# Patient Record
Sex: Male | Born: 1945 | Race: White | Hispanic: No | Marital: Married | Smoking: Former smoker
Health system: Southern US, Community
[De-identification: ages and names within clinical notes are randomized; demographics above are authoritative.]

## PROBLEM LIST (undated history)

## (undated) DIAGNOSIS — Z9989 Dependence on other enabling machines and devices: Secondary | ICD-10-CM

## (undated) DIAGNOSIS — A809 Acute poliomyelitis, unspecified: Secondary | ICD-10-CM

## (undated) DIAGNOSIS — N281 Cyst of kidney, acquired: Secondary | ICD-10-CM

## (undated) DIAGNOSIS — I499 Cardiac arrhythmia, unspecified: Secondary | ICD-10-CM

## (undated) DIAGNOSIS — I10 Essential (primary) hypertension: Secondary | ICD-10-CM

## (undated) DIAGNOSIS — H9190 Unspecified hearing loss, unspecified ear: Secondary | ICD-10-CM

## (undated) DIAGNOSIS — R7301 Impaired fasting glucose: Secondary | ICD-10-CM

## (undated) DIAGNOSIS — K429 Umbilical hernia without obstruction or gangrene: Secondary | ICD-10-CM

## (undated) DIAGNOSIS — K5790 Diverticulosis of intestine, part unspecified, without perforation or abscess without bleeding: Secondary | ICD-10-CM

## (undated) DIAGNOSIS — N529 Male erectile dysfunction, unspecified: Secondary | ICD-10-CM

## (undated) DIAGNOSIS — Z8042 Family history of malignant neoplasm of prostate: Secondary | ICD-10-CM

## (undated) DIAGNOSIS — Z973 Presence of spectacles and contact lenses: Secondary | ICD-10-CM

## (undated) DIAGNOSIS — C61 Malignant neoplasm of prostate: Secondary | ICD-10-CM

## (undated) DIAGNOSIS — N318 Other neuromuscular dysfunction of bladder: Secondary | ICD-10-CM

## (undated) DIAGNOSIS — M810 Age-related osteoporosis without current pathological fracture: Secondary | ICD-10-CM

## (undated) DIAGNOSIS — G14 Postpolio syndrome: Secondary | ICD-10-CM

## (undated) HISTORY — DX: Cardiac arrhythmia, unspecified: I49.9

## (undated) HISTORY — DX: Family history of malignant neoplasm of prostate: Z80.42

## (undated) HISTORY — DX: Umbilical hernia without obstruction or gangrene: K42.9

## (undated) HISTORY — DX: Diverticulosis of intestine, part unspecified, without perforation or abscess without bleeding: K57.90

## (undated) HISTORY — PX: SHOULDER ARTHROSCOPY WITH OPEN ROTATOR CUFF REPAIR: SHX6092

## (undated) HISTORY — PX: PROSTATE BIOPSY: SHX241

## (undated) HISTORY — PX: FOOT SURGERY: SHX648

## (undated) HISTORY — DX: Age-related osteoporosis without current pathological fracture: M81.0

## (undated) HISTORY — PX: TONSILLECTOMY AND ADENOIDECTOMY: SUR1326

## (undated) HISTORY — DX: Impaired fasting glucose: R73.01

---
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
## 2001-11-25 ENCOUNTER — Encounter: Admission: RE | Admit: 2001-11-25 | Discharge: 2002-02-23 | Payer: Self-pay | Admitting: Family Medicine

## 2001-12-30 ENCOUNTER — Ambulatory Visit (HOSPITAL_COMMUNITY): Admission: RE | Admit: 2001-12-30 | Discharge: 2001-12-30 | Payer: Self-pay | Admitting: Gastroenterology

## 2007-04-30 ENCOUNTER — Ambulatory Visit (HOSPITAL_BASED_OUTPATIENT_CLINIC_OR_DEPARTMENT_OTHER): Admission: RE | Admit: 2007-04-30 | Discharge: 2007-05-01 | Payer: Self-pay | Admitting: Orthopedic Surgery

## 2007-10-15 ENCOUNTER — Ambulatory Visit (HOSPITAL_BASED_OUTPATIENT_CLINIC_OR_DEPARTMENT_OTHER): Admission: RE | Admit: 2007-10-15 | Discharge: 2007-10-15 | Payer: Self-pay | Admitting: Orthopedic Surgery

## 2007-10-22 ENCOUNTER — Ambulatory Visit: Payer: Self-pay | Admitting: Vascular Surgery

## 2011-03-21 NOTE — Op Note (Signed)
Alan Rivera, Alan Rivera             ACCOUNT NO.:  [id number]   MEDICAL RECORD NO.:  [id number]          PATIENT TYPE:  AMB   LOCATION:  DSC                          FACILITY:  [facility]   PHYSICIAN:  Katy Fitch. Sypher, M.D. DATE OF BIRTH:  06-07-1946   DATE OF PROCEDURE:  04/30/2007  DATE OF DISCHARGE:                               OPERATIVE REPORT   PREOP DIAGNOSIS:  1. Chronic stage III impingement right shoulder with MRI evidence of a      full-thickness rotator cuff tear involving supraspinatus and      infraspinatus tendons with unfavorable acromial and AC joint      anatomy and chronic arthritis of the AC joint and MRI proven labral      tear.  2. Superior anterior labral tear consistent with MRI findings and full-      thickness mildly retracted rotator cuff tear involving the entire      supraspinatus and infraspinatus tendon footprints.   OPERATION:  1. Examination of right shoulder under anesthesia.  2. Arthroscopic evaluation of right glenohumeral joint followed by      arthroscopic debridement of fragments of a degenerative superior      and anterior labral tear.  3. Arthroscopic subacromial decompression with acromioplasty,      coracoacromial ligament relaxation, and bursectomy.  4. Arthroscopic resection of distal clavicle.  5. 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   OPERATING SURGEON:  Katy Fitch. Sypher, M.D.   ASSISTANT:  Molly Maduro Dasnoit PA-C.   ANESTHESIA:  General endotracheal supplemented by a right interscalene  block.   SUPERVISING ANESTHESIOLOGIST:  Burna Forts, M.D.   INDICATIONS:  Alan Rivera, Alan Rivera is a 65 year old gentleman referred  through the courtesy of Dr. Illa Level, M.D. for evaluation of a  chronically painful right shoulder.  Alan Rivera has had several years  of shoulder pain and clinical examination demonstrated very  unfavorable  AC and acromial anatomy.  He had marked sclerosis of the greater  tuberosity and cyst formation in the tuberosity consistent with rotator  cuff disease.  Plain films documented severe AC arthropathy; and his MRI  documented a labral tear, a rotator cuff tear, unfavorable acromial and  AC anatomy.  We recommended that he proceed with arthroscopic evaluation  of his shoulder, labral debridement, subacromial decompression, distal  clavicle resection, and open rotator cuff repair.  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  He has made arrangements for additional assistive  devices at home; and waited until his wife was off for the summer to  have maximum help with activities of daily living at home.   After informed consent he is brought to the operating room at this time.  Preoperatively he was advised of the potential risks and benefits of  surgery in detail.  Questions were invited and answered.   PROCEDURE:  Alan Rivera is brought to the operating room and placed  in the supine position on  the operating table.  After an anesthesia  consult in the holding area, Dr. Jacklynn Bue had placed an interscalene  block without complication.  Excellent anesthesia of the right  forequarter was achieved.  Alan Rivera was placed under general  endotracheal anesthesia under Dr. Jacklynn Bue direct supervision followed  by careful positioning in the beach-chair position with the aid of a  torso and head holder designed for short arthroscopy.   The entire right upper extremity and forequarter were prepped with  DuraPrep followed by draping with impervious arthroscopy drapes.  Examination of the shoulder under anesthesia revealed no evidence of  instability or adhesive capsulitis.  The scope was placed through with  standard posterior viewing portal.  Diagnostic arthroscopy revealed  intact hyaline articular cartilage surfaces of  the humeral head and  glenoid.  There was a degenerative labrum from 11 o'clock posteriorly to  approximately 3 o'clock anteriorly.  Fragments of the labrum were  impinging within the joint.   An anterior portal was created under direct vision followed by use of  the 4.5-mm suction shaver to thoroughly debride the labrum to a stable  margin.  The biceps origin was sound.  The biceps tendon was normal  through the rotator interval.  There is minor subscapularis tendinopathy  anteriorly, but no sign of retracted tear.  The deep surface of the  infraspinatus and supraspinatus had completely released from the greater  tuberosity, and had retracted less than a centimeter.  After a  photographic documentation of pathology, the deep surface of the rotator  cuff was debrided followed by removal of the arthroscopic equipment.  The bursa was then instrumented from posterior approach followed by  bursectomy, coracoacromial ligament relaxation, and acromioplasty  leveling the acromion to a type 1 morphology with primary bone removal  along the medial anterior margin of the AC joint and anterior medial  acromion.   The distal 15 mm of clavicle was removed arthroscopically and hemostasis  achieved with the radiofrequency probe.  After documentation of the  decompression, the scope was removed and an anterior middle-third  deltoid splitting incision was fashioned without release of any of the  deltoid origin.  After the bursa was entered, a cerebellar retractor was  placed followed by reconstruction of the rotator cuff.  The profile the  greater tuberosity was lowered 3-4 mm with the power bur, removing the  reactive osteophyte that had formed from chronic impingement.  The  insertional anatomy of the supraspinatus and infraspinatus was  decorticated with the power bur followed by placement of a medial  corkscrew anchor.  One for the supraspinatus, and one for the  infraspinatus.  A McLaughlin  grasping suture was placed in the  supraspinatus and infraspinatus followed by lateral displacement of the  conjoined tendon to an anatomic position with through bone tunnels.  This was tensioned, and prior to tying knots the four mattress sutures  from the anchors were placed through the infraspinatus and supraspinatus  creating an excellent medial footprint.  The McLaughlin suture was  tensioned reproducing a normal lateralized insertion of the conjoined  tendon followed by use of the mattress sutures from the biocorkscrew  anchors to inset the medial footprints of the infraspinatus and  supraspinatus.   We attempted an over-the-top suture technique utilizing an Arthrex push  lock anchor.  We discovered that this led to a technical issue with  incompatibility between the Biomet sutures which are coated and the push  lock.  The push lock was placed with  standard technique and driven home  with a mallet.  After the introducer device was removed, I noted  immediately tension was relaxing in the sutures leading me to conclude  that inadequate purchase was achieved with the push lock.  The sutures  indeed were loose; therefore, they were converted to traditional  mattress sutures followed by use of a series of figure-of-eight sutures  of #0 Vicryl to inset the lateral margin of the repair with through bone  suture technique.   An anatomic repair of the rotator cuff was achieved.  After power  irrigation in the subacromial space.  The wound was closed by a corner  suture of #2 FiberWire reconstructing the origin of the anterior middle  third junction of the deltoid followed by use of simple sutures of #0  Vicryl to close the deltoid split.  The skin was repaired with subdermal  sutures of 2-0 Vicryl and intradermal 3-00 Prolene with Steri-Strips.   There no apparent complications.  Alan Rivera tolerated surgery and  anesthesia well.  He was awakened from general anesthesia and   transferred to the recovery room with stable signs.  He will be admitted  to the recovery care center for observation of his vital signs; and we  have provided IV antibiotic prophylaxis in the form of Ancef 1 gram q.8  h. times three doses and appropriate p.o. and IV analgesics with  Dilaudid and Percocet.      Katy Fitch Sypher, M.D.  Electronically Signed     RVS/MEDQ  D:  04/30/2007  T:  05/01/2007  Job:  [id number]   cc:   Sal[employer] Dr. Illa Level

## 2011-03-21 NOTE — Consult Note (Signed)
VASCULAR SURGERY CONSULTATION   KHAIRI, GARMAN  DOB:  May 26, 1946                                       10/22/2007  ZOXWR#:[id number]   Patient is a 65 year old male patient with a history of polio at age 74,  which effected all of his musculature.  He has had diffuse weakness in  his upper and lower extremities, and the lower extremity weakness has  progressed over the last few years, involving more of his proximal  muscles.  He has noticed a cold sensation in both legs and feet over the  last year but has had no history of nonhealing ulcers, infection,  gangrene, or rest pain in the feet.  He is able to ambulate about 50-75  yards because of his post polio syndrome.   PAST MEDICAL HISTORY:  Positive for hypertension, treated medically.  Negative for diabetes, coronary artery disease, COPD, or stroke.   PAST SURGICAL HISTORY:  1. Bilateral lower extremity surgery on the feet as a child for his      polio.  2. Hernia repair.  3. Tonsillectomy and adenoidectomy.  4. Right rotator cuff surgery.  5. Arthroscopic surgery, left shoulder, a few months ago.   FAMILY HISTORY:  Negative for coronary artery disease, diabetes and  strokes.   SOCIAL HISTORY:  He is married.  Works as a Production manager for [employer].  Has two  children.  Does not use tobacco or alcohol.   REVIEW OF SYSTEMS:  Negative with the exception of his musculature  problems.  Please see health history form.   MEDICATIONS:  1. Aspirin 2 daily.  2. Lisinopril 5 mg 1 daily.  3. Multivitamins 1 daily.  4. Ibuprofen 600 mg 4 per day.   ALLERGIES:  SHELLFISH.Marland Kitchen   PHYSICAL EXAMINATION:  Blood pressure 122/78, heart rate is 80,  respirations 16.  Generally, he is a middle-aged male patient who has  obvious muscle wasting in his lower extremities.  He is alert and  oriented x3.  His neck is supple, 3+ carotid pulses palpable.  No bruits  are audible.  Neurologic exam in the upper extremities, grossly  unremarkable.  He has no palpable adenopathy in the neck.  Chest: Clear  to auscultation.  Cardiovascular:  Regular rhythm with no murmurs.  He  has excellent radial pulses bilaterally.  No skin rashes are noted.  Abdomen is soft and nontender with no palpable masses. He has 3+  femoral, popliteal, and posterior tibial pulses bilaterally.  Both feet  are adequately perfused, but there is muscle wasting, as noted, in the  calves and thighs bilaterally.  There is no evidence of any infection,  ulceration, or gangrene.   Lower extremity arterial Dopplers were done today and were normal with  ABIs greater than 1 bilaterally and normal waveforms.   I have reassured him regarding his vasculature, that there are no  abnormalities, and that his leg weakness is clearly due to his post  polio syndrome.  If we can be of further assistance in his management in  the future, we will be happy to do so at that time.   Quita Skye Hart [place], M.D.  Electronically Signed  JDL/MEDQ  D:  10/22/2007  T:  10/23/2007  Job:  [id number]   cc:   Vikki Ports, M.D.  Chales Salmon. Abigail Miyamoto, M.D.

## 2011-03-21 NOTE — Op Note (Signed)
NAMEEMANUAL, LAMOUNTAIN             ACCOUNT NO.:  [id number]   MEDICAL RECORD NO.:  [id number]          PATIENT TYPE:  AMB   LOCATION:  DSC                          FACILITY:  [facility]   PHYSICIAN:  Katy Fitch. Sypher, M.D. DATE OF BIRTH:  07-Dec-1945   DATE OF PROCEDURE:  10/15/2007  DATE OF DISCHARGE:                               OPERATIVE REPORT   PREOPERATIVE DIAGNOSIS:  Stage 2 impingement, left shoulder, with severe  acromioclavicular degenerative arthritis and unfavorable medial acromial  and inferior distal clavicle profile causing chronic impingement on  rotator cuff, with MRI evidence of a partial-thickness deep surface  rotator cuff tear, a stress reaction in the left humeral head, and  probable labral degenerative changes.   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   PROCEDURE:  1. Examination of left shoulder under anesthesia followed by      diagnostic arthroscopy of the left shoulder.  2. Arthroscopic debridement of degenerative labrum and rotator cuff      tear.  3. Arthroscopic subacromial decompression with bursectomy,      coracoacromial ligament relaxation, and bursectomy.  4. Arthroscopic distal clavicle resection.   OPERATING SURGEON:  Katy Fitch. Sypher, M.D.   ASSISTANT:  Marveen Reeks. Dasnoit, P.A.-C.   ANESTHESIA:  General endotracheal supplemented by left interscalene  block.   SUPERVISING ANESTHESIOLOGIST:  Zenon Mayo, M.D.   INDICATIONS FOR PROCEDURE:  Bud, Kaeser. is a 65 year old  gentleman referred through the courtesy of Dr. Henrine Screws of [place],  [facility] for evaluation and management of left shoulder pain.  He is status post  reconstruction of his right rotator cuff with a very  satisfactory outcome.   Mr. Lama had concerns regarding persistent left shoulder pain and had  plain x-rays documenting very unfavorable AC anatomy.   An MRI of the left shoulder was obtained which documented a partial-  thickness rotator cuff tear.   We advised Mr. Covalt that at this time we would proceed with  arthroscopic decompression of the shoulder, anticipating possible repair  if we identified a full-thickness rotator cuff tear.   After informed consent, he is brought to the operating room at this  time.   Preoperatively, he was seen in consultation by Dr. Autumn Patty who  placed a left interscalene block.  Excellent anesthesia was achieved  without complication.   Mr. Bleier is brought to the operating room at this time.   DESCRIPTION OF PROCEDURE:  Sandra Cockayne, Montez Hageman. is brought to the  operating room and placed in the supine position on the operating room  table.  Following induction of general endotracheal anesthesia under Dr.  Jarrett Ables direct supervision, he was carefully positioned in the  beach chair position with the aid of a torso and head holder designed  for shoulder arthroscopy.   The entire left upper extremity and forequarter were prepped with  DuraPrep and draped with  impervious arthroscopy drapes.  Ancef 1 gram  was administered as an IV prophylactic antibiotic.   The procedure commenced with examination of the shoulder under  anesthesia.  The shoulder was fundamentally stable.  The shoulder was  then instrumented through the standard posterior viewing portal with an  arthroscope utilizing blunt technique.  Diagnostic arthroscopy revealed  a very degenerative labrum extending from 3 o'clock anteriorly to 9  o'clock posteriorly.  Fragments of labrum were hanging within the joint.  There was a moderate degree of synovitis.   An anterior portal was created under direct vision after  sounding with a  spinal needle followed by use of a 4.5-mm suction shaver to thoroughly  debride the glenohumeral synovitis, labrum, and after use of a switching  stick to place the scope anteriorly, the posterior labrum was thoroughly  debrided under direct vision with the shaver brought in through the  posterior viewing portal.   The deep surface of the rotator cuff was inspected.  There was about a  50% deep surface articular side partial-thickness tear.   Given the fact that the subscapularis, teres minor, and posterior  infraspinatus were sound and the anterior portion of the supraspinatus  was sound, in my judgment, debridement was acceptable at this time.   We had discussed the possibility of an arthroscopic or open repair if a  significant tear was noted.   Given the dramatic degree of AC impingement, there is a high probability  that Mr. Kincer will be relieved of his symptoms without subjecting him  to the prolonged rehabilitation of a cuff repair at this time.   We elected to debride the deep surface of the partial articular surface  tear with a suction shaver followed by removal of the arthroscopic  equipment.   The scope was then placed in the subacromial space followed by  bursectomy, relaxation of the coracoacromial ligament, leveling the  acromion to a type 1 morphology, and resection of the distal 18 mm of  the clavicle with the suction bur brought in anteriorly.  The cuff was  inspected and found to be intact at its insertion at the greater  tuberosity.  There was considerable fraying of the cuff directly beneath  the [facility] joint due to chronic piling up of the tendon beneath the AC  osteophytes.   Photographic documentation of the pathology was accomplished with the  digital camera followed by removal of the arthroscopic equipment.   The portals were repaired with mattress suture of 3-0 Prolene.  Voluminous gauze dressings were applied with paper tape.   Mr.  Strohm will be allowed to return home to the care of his wife.  We  will ask him to return to our office for followup in 24 hours at which  time we anticipate dressing change to Tegaderm dressings and advancement  to an exercise program.      Katy Fitch. Sypher, M.D.  Electronically Signed     RVS/MEDQ  D:  10/15/2007  T:  10/15/2007  Job:  [id number]   cc:   Chales Salmon. Abigail Miyamoto, M.D.

## 2011-03-24 NOTE — Procedures (Signed)
[facility]  Patient:    Alan Rivera, Alan Rivera Visit Number: [id number] MRN: [id number]          Service Type: END Location: ENDO Attending Physician:  Louie Bun Dictated by:   Everardo All Madilyn Fireman, M.D. Proc. Date: 12/30/01 Admit Date:  12/30/2001   CC:         Chales Salmon. Abigail Miyamoto, M.D.   Procedure Report  PROCEDURE:  Colonoscopy.  INDICATION FOR PROCEDURE:  Heme-positive stool.  DESCRIPTION OF PROCEDURE:  The patient was placed in the left lateral decubitus position and placed on the pulse monitor with continuous low-flow oxygen delivered by nasal cannula.  He was sedated with 80 mg of IV Demerol and 8 mg of IV Versed.  The Olympus video colonoscope was inserted into the rectum and advanced to the cecum, confirmed by transillumination at McBurneys point and visualization of the ileocecal valve and appendiceal orifice.  The prep was excellent but distal to the hepatic flexure was somewhat suboptimal proximally with inability to rule out small lesions less than 1 cm in all areas.  The terminal ileum was intubated and explored for several centimeters and appeared to be within normal limits.  The cecum and ascending colon appeared normal with no masses, polyps, diverticula, or other mucosal abnormalities.  Within the transverse colon, there were seen two linear, longitudinally arranged, fibrotic scars opposite each other, extending for about 10 cm with no obvious associated inflammation or ulceration.  The significance of this is unclear.  There was no stricture or other abnormality noted, and the mucosa surrounding these areas appeared normal.  The descending and sigmoid colon likewise appeared normal with no masses, polyps, diverticula, or other mucosal abnormalities.  The rectum likewise appeared normal, and retroflex view of the anus did reveal some small internal hemorrhoids and moderate-sized external hemorrhoids.  The scope was then withdrawn,  and the patient returned to the recovery room in stable condition. He tolerated the procedure well, and there were no immediate complications.  IMPRESSION: 1. Two linear areas of fibrosis in his transverse colon, significance unclear. 2. Internal and external hemorrhoids.  PLAN: 1. Treat hemorrhoids symptomatically. 2. Resume colon screening with Hemoccults and sigmoidoscopy in five years. Dictated by:   Everardo All Madilyn Fireman, M.D. Attending Physician:  Louie Bun DD:  12/30/01 TD:  12/30/01 Job: [id number]

## 2011-08-14 LAB — I-STAT EC8
Acid-Base Excess: 2
BUN: 13
Bicarbonate: 26.5 — ABNORMAL HIGH
HCT: 49
Hemoglobin: 16.7
Operator id: 116011
Sodium: 140
TCO2: 28

## 2011-08-23 LAB — BASIC METABOLIC PANEL
BUN: 11
Calcium: 8.7
Creatinine, Ser: 0.68
GFR calc non Af Amer: >60
Glucose, Bld: 102 — ABNORMAL HIGH

## 2016-09-04 ENCOUNTER — Other Ambulatory Visit: Payer: Self-pay | Admitting: Urology

## 2016-09-04 DIAGNOSIS — C61 Malignant neoplasm of prostate: Secondary | ICD-10-CM

## 2016-09-07 ENCOUNTER — Ambulatory Visit: Payer: Medicare Other

## 2016-09-07 ENCOUNTER — Ambulatory Visit: Payer: Medicare Other | Admitting: Radiation Oncology

## 2016-09-12 ENCOUNTER — Encounter (HOSPITAL_COMMUNITY)
Admission: RE | Admit: 2016-09-12 | Discharge: 2016-09-12 | Disposition: A | Discharge status: Home / Self Care | Payer: Medicare Other | Source: Ambulatory Visit | Attending: Urology | Admitting: Urology

## 2016-09-12 DIAGNOSIS — C61 Malignant neoplasm of prostate: Secondary | ICD-10-CM | POA: Insufficient documentation

## 2016-09-12 MED ORDER — TECHNETIUM TC 99M MEDRONATE IV KIT
20.2000 | PACK | Freq: Once | INTRAVENOUS | Status: AC | PRN
  Administered 2016-09-12: 20.2 via INTRAVENOUS

## 2016-09-15 ENCOUNTER — Encounter: Payer: Self-pay | Admitting: Radiation Oncology

## 2016-09-15 NOTE — Progress Notes (Signed)
GU Location of Tumor / Histology: prostatic adenocarcinoma  If Prostate Cancer, Gleason Score is (4 + 5) and PSA is (6.43)  Alan Rivera was referred to Dr. Alyson Ingles by PCP, [name], for evaluation of an elevated PSA.  Biopsies of prostate (if applicable) revealed:    Past/Anticipated interventions by urology, if any: prostate needle biopsy 08/28/16, referral to radiation oncology  Past/Anticipated interventions by medical oncology, if any: no  Weight changes, if any: switched to a vegan diet following diagnosis and has lost 10 lb as a result  Bowel/Bladder complaints, if any: yes, urinary frequency, difficulty emptying his bladder, intermittent urine stream, nocturia x 1, urinary urgency, weak stream, post void dribble, and hesitancy . Denies any bowel complaints. Denies dysuria, leakage or incontinence Reports blood in semen and urine are resolving.  Nausea/Vomiting, if any: no  Pain issues, if any:  no  SAFETY ISSUES:  Prior radiation? no  Pacemaker/ICD? no  Possible current pregnancy? no  Is the patient on methotrexate? no  Current Complaints / other details:  70 year old male. Father suffered from lung and prostate cancer. Uses rolling walker to ambulate. Bone scan reveals degenerative changes but no metastatic disease.Married for 50 years. Reports he fell in March. Reports since fall he has had a sore bottom. Also, reports progressive soreness and numbness around his mouth.

## 2016-09-18 ENCOUNTER — Ambulatory Visit
Admission: RE | Admit: 2016-09-18 | Discharge: 2016-09-18 | Disposition: A | Discharge status: Home / Self Care | Payer: Medicare Other | Source: Ambulatory Visit | Attending: Radiation Oncology | Admitting: Radiation Oncology

## 2016-09-18 ENCOUNTER — Encounter: Payer: Self-pay | Admitting: Radiation Oncology

## 2016-09-18 ENCOUNTER — Encounter: Payer: Self-pay | Admitting: Medical Oncology

## 2016-09-18 VITALS — BP 93/64 | HR 65 | Resp 16 | Ht 67.0 in | Wt 164.8 lb

## 2016-09-18 DIAGNOSIS — A809 Acute poliomyelitis, unspecified: Secondary | ICD-10-CM

## 2016-09-18 DIAGNOSIS — Z7982 Long term (current) use of aspirin: Secondary | ICD-10-CM | POA: Diagnosis not present

## 2016-09-18 DIAGNOSIS — Z809 Family history of malignant neoplasm, unspecified: Secondary | ICD-10-CM | POA: Diagnosis not present

## 2016-09-18 DIAGNOSIS — Z79899 Other long term (current) drug therapy: Secondary | ICD-10-CM | POA: Diagnosis not present

## 2016-09-18 DIAGNOSIS — I10 Essential (primary) hypertension: Secondary | ICD-10-CM | POA: Insufficient documentation

## 2016-09-18 DIAGNOSIS — Z87891 Personal history of nicotine dependence: Secondary | ICD-10-CM | POA: Insufficient documentation

## 2016-09-18 DIAGNOSIS — Z8612 Personal history of poliomyelitis: Secondary | ICD-10-CM | POA: Insufficient documentation

## 2016-09-18 DIAGNOSIS — C61 Malignant neoplasm of prostate: Secondary | ICD-10-CM

## 2016-09-18 DIAGNOSIS — Z7983 Long term (current) use of bisphosphonates: Secondary | ICD-10-CM | POA: Insufficient documentation

## 2016-09-18 HISTORY — DX: Malignant neoplasm of prostate: C61

## 2016-09-18 HISTORY — DX: Acute poliomyelitis, unspecified: A80.9

## 2016-09-18 HISTORY — DX: Essential (primary) hypertension: I10

## 2016-09-18 NOTE — Progress Notes (Signed)
See progress note under physician encounter. 

## 2016-09-18 NOTE — Progress Notes (Signed)
Radiation Oncology         [phone] ________________________________  Initial Outpatient Consultation  Name: Alan Rivera MRN: [id number]  Date: 09/18/2016  DOB: 1945/12/24  [id number] Marigene Ehlers, MD  McKenzie, Candee Furbish, MD   REFERRING PHYSICIAN: [name], MD  DIAGNOSIS: 70 y.o. 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 Malignant neoplasm of prostate ([place]) [place] ILLNESS::Alan Rivera is a 70 y.o. gentleman.  He was noted to have an elevated PSA of 6.34 by his primary care physician, Dr. Rex Kras.  Accordingly, he was referred for evaluation in urology by Dr. Alyson Ingles on 06/22/16,  digital rectal examination was performed at that time revealing a 10 mm nodule in the left apex of a 30 gram gland.  The patient proceeded to transrectal ultrasound with 12 biopsies of the prostate on 08/28/16.  The prostate volume measured 43 cc.  Out of 12 core biopsies, 8 were positive.  The maximum Gleason score was 4+5, and this was seen in the left side. Gleason score 4+4 was noted in the left base lateral and right apex. Gleason score 4+3 was noted in right mid. Gleason score 3+4 was noted in the right apex lateral. Gleason score 3+3 was seen in the right mid lateral.  The patient had a bone scan on 09/12/16 no evidence of metastatic disease. CT scan the same day noted a 11.1 x 10.2 cm simple cyst identified in the interpolar right kidney, a 1.4 cm heterogeneous lesion interpolar left kidney that may have central enhancement (MRI without contrast recommended to further evaluate), and bilateral renal cysts.  The patient reviewed the biopsy results with his urologist and he has kindly been referred today, with his wife, for discussion of potential radiation treatment options.  PSA: 06/2016: 6.68 04/2016: 5.02 03/2016: 6.34  PREVIOUS RADIATION THERAPY: No  PAST MEDICAL  HISTORY:  has a past medical history of Polio and Prostate cancer ([place]).    PAST SURGICAL HISTORY: Past Surgical History:  Procedure Laterality Date  . FOOT SURGERY     bilateral related to effects of polio  . PROSTATE BIOPSY    . TONSILLECTOMY AND ADENOIDECTOMY    . VASECTOMY      FAMILY HISTORY: family history includes Cancer in his father.  SOCIAL HISTORY:  reports that he quit smoking about 35 years ago. His smoking use included Cigarettes. He has never used smokeless tobacco. He reports that he does not drink alcohol or use drugs.  ALLERGIES: Shellfish allergy  MEDICATIONS:  Current Outpatient Prescriptions  Medication Sig Dispense Refill  . alendronate (FOSAMAX) 70 MG tablet Take 70 mg by mouth once a week. Take with a full glass of water on an empty stomach.    Marland Kitchen aspirin EC 81 MG tablet Take 81 mg by mouth daily.    . cholecalciferol (VITAMIN D) 1000 units tablet Take 1,000 Units by mouth daily.    Marland Kitchen lisinopril (PRINIVIL,ZESTRIL) 10 MG tablet Take 10 mg by mouth daily.    . Multiple Vitamin (MULTIVITAMIN) tablet Take 1 tablet by mouth daily.     No current facility-administered medications for this encounter.     REVIEW OF SYSTEMS:  A 15 point review of systems is documented in the electronic medical record. This was obtained by the nursing staff. However, I reviewed this with the patient to discuss relevant findings and make appropriate  changes.  Pertinent items noted in HPI and remainder of comprehensive ROS otherwise negative..  The patient completed an IPSS and IIEF questionnaire.  His IPSS score was 13 indicating mild urinary outflow obstructive symptoms.  He indicated that his erectile function is capable to complete sexual activity on all attempts.  He reports urinary frequency, difficulty emptying his bladder, intermittent urine stream, nocturia x1, urinary urgency, weak stream, post void dribble, and hesitancy. He denies any bowel complaints, dysuria, leakage,  incontinence. He reports blood in semen and urine are resolving. The patient reports falling in March and since then he has had a sore bottom.   PHYSICAL EXAM: This patient is in no acute distress.  He is alert and oriented.   height is 5\' 7"  (1.702 m) and weight is 164 lb 12.8 oz (74.8 kg). His respiration is 16 and oxygen saturation is 100%.  He exhibits no respiratory distress or labored breathing.  He appears neurologically intact.  His mood is pleasant.  His affect is appropriate.  Please note the digital rectal exam findings described above. The patient presents to the clinic with a walker.  KPS = 90  100 - Normal; no complaints; no evidence of disease. 90   - Able to carry on normal activity; minor signs or symptoms of disease. 80   - Normal activity with effort; some signs or symptoms of disease. 79   - Cares for self; unable to carry on normal activity or to do active work. 60   - Requires occasional assistance, but is able to care for most of his personal needs. 50   - Requires considerable assistance and frequent medical care. 42   - Disabled; requires special care and assistance. 66   - Severely disabled; hospital admission is indicated although death not imminent. 84   - Very sick; hospital admission necessary; active supportive treatment necessary. 10   - Moribund; fatal processes progressing rapidly. 0     - Dead  Karnofsky DA, Abelmann [place], Craver LS and Burchenal [facility] [place] [phone]) The use of the nitrogen mustards in the palliative treatment of carcinoma: with particular reference to bronchogenic carcinoma Cancer 1 [id number]   LABORATORY DATA:  Lab Results  Component Value Date   HGB 16.7 10/15/2007   HCT 49.0 10/15/2007   Lab Results  Component Value Date   NA 140 10/15/2007   K 4.3 10/15/2007   CL 108 10/15/2007   CO2 27 04/25/2007   No results found for: ALT, AST, GGT, ALKPHOS, BILITOT   RADIOGRAPHY: Nm Bone Scan Whole Body  Result Date: 09/12/2016 CLINICAL DATA:  History  of prostate carcinoma EXAM: NUCLEAR MEDICINE WHOLE BODY BONE SCAN TECHNIQUE: Whole body anterior and posterior images were obtained approximately 3 hours after intravenous injection of radiopharmaceutical. RADIOPHARMACEUTICALS:  20.2 mCi Technetium-78m MDP IV COMPARISON:  CT from earlier in the same day FINDINGS: There is adequate uptake of radioactive tracer throughout the bony skeleton. Bilateral renal activity with pooling in the urinary bladder is noted. Scattered areas of degenerative change are noted within the proximal and distal clavicles as well as about the knee joints and ankle joints bilaterally. No focal area of increased activity is identified to suggest metastatic disease. IMPRESSION: Degenerative change without evidence of metastatic disease. Electronically Signed   By: Inez Catalina M.D.   On: 09/12/2016 20:29      IMPRESSION: This gentleman is a 70 y.o. 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  His T-Stage, Gleason's Score, and PSA put him into the high risk group.  Accordingly he is eligible for a variety of potential treatment options including prostatectomy (but with the chance of needing external beam radiation in the future), 5 weeks external radiation and then seed implant boost, or IMRT alone. Androgen depravation therapy would be started before proceeding with IMRT and he would be on the shots for approximately 2 years.  PLAN: Today I reviewed the findings and workup thus far.  We discussed the natural history of prostate cancer.  We reviewed the the implications of T-stage, Gleason's Score, and PSA on decision-making and outcomes in prostate cancer.  We discussed radiation treatment in the management of prostate cancer with regard to the logistics and delivery of external beam radiation treatment as well as the logistics and delivery of prostate brachytherapy.  The patient would like to proceed with prostate IMRT.  I  will share my findings with Dr. Alyson Ingles and move forward with scheduling placement of three gold fiducial markers into the prostate to proceed with IMRT in January 2018. We would like the patient to start androgen depravation therapy before then.  I enjoyed meeting with him today, and will look forward to participating in the care of this very nice gentleman.  I spent 60 minutes face to face with the patient and more than 50% of that time was spent in counseling and/or coordination of care.   ------------------------------------------------  Sheral Apley. Tammi Klippel, M.D.  This document serves as a record of services personally performed by Tyler Pita, MD. It was created on his behalf by Darcus Austin, a trained medical scribe. The creation of this record is based on the scribe's personal observations and the provider's statements to them. This document has been checked and approved by the attending provider.

## 2016-09-19 ENCOUNTER — Telehealth: Payer: Self-pay | Admitting: *Deleted

## 2016-09-19 NOTE — Telephone Encounter (Signed)
CALLED PATIENT TO INFORM OF HORMONE INJ. FOR 09-22-16-  ARRIVAL TIME - 2:30 PM @ DR. Noland Fordyce OFFICE AND HIS GOLD SEED PLACEMENT ON 11-09-16 - ARRIVAL TIME - 12:30 PM @ DR. Noland Fordyce OFFICE AND HIS SIM ON 11-17-16 @ 1 PM @ DR. MANNING'S OFFICE, SPOKE WITH PATIENT AND HE IS AWARE OF THESE APPTS.

## 2016-09-22 ENCOUNTER — Encounter: Payer: Self-pay | Admitting: Medical Oncology

## 2016-09-27 ENCOUNTER — Telehealth: Payer: Self-pay | Admitting: Radiation Oncology

## 2016-09-27 NOTE — Telephone Encounter (Signed)
I spoke with the patient today and he is scheduled for simulation 11/18/15. He had ADT on 09/22/16, and will have fiducial markers on 11/10/15. He has elected for 8 weeks of IMRT rather than 5 weeks IMRT plus radioactive seed boost. He will keep un informed of any questions he has prior to his next visit.

## 2016-11-17 ENCOUNTER — Ambulatory Visit
Admission: RE | Admit: 2016-11-17 | Discharge: 2016-11-17 | Disposition: A | Discharge status: Home / Self Care | Payer: Medicare Other | Source: Ambulatory Visit | Attending: Radiation Oncology | Admitting: Radiation Oncology

## 2016-11-17 DIAGNOSIS — C61 Malignant neoplasm of prostate: Secondary | ICD-10-CM

## 2016-11-17 NOTE — Progress Notes (Signed)
  Radiation Oncology         [phone]) [phone] ________________________________  Name: Alan Rivera MRN: [id number]  Date: 11/17/2016  DOB: [birth year]  SIMULATION AND TREATMENT PLANNING NOTE    ICD-9-CM ICD-10-CM   1. Malignant neoplasm of prostate ([place]) 185 C61     DIAGNOSIS:  71 y.o. 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  ________________________________  Sheral Apley Tammi Klippel, M.D.

## 2016-11-29 ENCOUNTER — Telehealth: Payer: Self-pay | Admitting: *Deleted

## 2016-11-29 ENCOUNTER — Ambulatory Visit: Payer: Medicare Other

## 2016-11-29 NOTE — Telephone Encounter (Signed)
Called patient to give an update on procedure, spoke with patient and he is aware of what I know.

## 2016-11-30 ENCOUNTER — Ambulatory Visit: Payer: Medicare Other

## 2016-12-01 ENCOUNTER — Ambulatory Visit: Payer: Medicare Other

## 2016-12-04 ENCOUNTER — Ambulatory Visit: Payer: Medicare Other

## 2016-12-05 ENCOUNTER — Ambulatory Visit: Payer: Medicare Other

## 2016-12-06 ENCOUNTER — Ambulatory Visit: Payer: Medicare Other

## 2016-12-07 ENCOUNTER — Ambulatory Visit: Payer: Medicare Other

## 2016-12-08 ENCOUNTER — Ambulatory Visit: Payer: Medicare Other

## 2016-12-11 ENCOUNTER — Ambulatory Visit: Payer: Medicare Other

## 2016-12-12 ENCOUNTER — Ambulatory Visit: Payer: Medicare Other

## 2016-12-13 ENCOUNTER — Ambulatory Visit: Payer: Medicare Other

## 2016-12-14 ENCOUNTER — Other Ambulatory Visit: Payer: Self-pay | Admitting: Urology

## 2016-12-14 ENCOUNTER — Ambulatory Visit: Payer: Medicare Other

## 2016-12-15 ENCOUNTER — Ambulatory Visit: Payer: Medicare Other

## 2016-12-18 ENCOUNTER — Ambulatory Visit: Payer: Medicare Other

## 2016-12-19 ENCOUNTER — Ambulatory Visit: Payer: Medicare Other

## 2016-12-20 ENCOUNTER — Ambulatory Visit: Payer: Medicare Other

## 2016-12-21 ENCOUNTER — Ambulatory Visit: Payer: Medicare Other

## 2016-12-22 ENCOUNTER — Other Ambulatory Visit: Payer: Self-pay | Admitting: Urology

## 2016-12-22 ENCOUNTER — Ambulatory Visit: Payer: Medicare Other

## 2016-12-22 DIAGNOSIS — C61 Malignant neoplasm of prostate: Secondary | ICD-10-CM

## 2016-12-25 ENCOUNTER — Ambulatory Visit: Payer: Medicare Other

## 2016-12-26 ENCOUNTER — Ambulatory Visit: Payer: Medicare Other

## 2016-12-27 ENCOUNTER — Ambulatory Visit: Payer: Medicare Other

## 2016-12-28 ENCOUNTER — Ambulatory Visit: Payer: Medicare Other

## 2016-12-29 ENCOUNTER — Ambulatory Visit: Payer: Medicare Other

## 2016-12-29 ENCOUNTER — Encounter (HOSPITAL_BASED_OUTPATIENT_CLINIC_OR_DEPARTMENT_OTHER): Payer: Self-pay | Admitting: *Deleted

## 2017-01-01 ENCOUNTER — Encounter (HOSPITAL_BASED_OUTPATIENT_CLINIC_OR_DEPARTMENT_OTHER): Payer: Self-pay | Admitting: *Deleted

## 2017-01-01 ENCOUNTER — Other Ambulatory Visit: Payer: Self-pay | Admitting: Urology

## 2017-01-01 ENCOUNTER — Ambulatory Visit: Payer: Medicare Other

## 2017-01-01 DIAGNOSIS — C61 Malignant neoplasm of prostate: Secondary | ICD-10-CM

## 2017-01-01 NOTE — Progress Notes (Signed)
Spoke w pt.  He will get labs drawn tomorrow before 2pm.  He is aware that he can come any day prior to Friday as long as it's before 2pm. Pt verbalized his understanding.

## 2017-01-01 NOTE — Progress Notes (Signed)
Left message for [facility] regarding order clarification about preop labs.

## 2017-01-01 NOTE — Progress Notes (Addendum)
Pt instructed npo pmn 01/04/17.  To [facility] 01/05/17 @ 0815.  Pt needs ekg on arrival.pt aware to do fleets enema am of surgery.

## 2017-01-02 ENCOUNTER — Ambulatory Visit: Payer: Medicare Other

## 2017-01-02 ENCOUNTER — Ambulatory Visit (HOSPITAL_COMMUNITY)
Admission: RE | Admit: 2017-01-02 | Discharge: 2017-01-02 | Disposition: A | Discharge status: Home / Self Care | Payer: Medicare Other | Source: Ambulatory Visit | Attending: Urology | Admitting: Urology

## 2017-01-02 DIAGNOSIS — Z01818 Encounter for other preprocedural examination: Secondary | ICD-10-CM | POA: Diagnosis present

## 2017-01-02 DIAGNOSIS — Z7982 Long term (current) use of aspirin: Secondary | ICD-10-CM | POA: Diagnosis not present

## 2017-01-02 DIAGNOSIS — Z87891 Personal history of nicotine dependence: Secondary | ICD-10-CM | POA: Diagnosis not present

## 2017-01-02 DIAGNOSIS — Z79899 Other long term (current) drug therapy: Secondary | ICD-10-CM | POA: Diagnosis not present

## 2017-01-02 DIAGNOSIS — M5134 Other intervertebral disc degeneration, thoracic region: Secondary | ICD-10-CM | POA: Diagnosis not present

## 2017-01-02 DIAGNOSIS — R531 Weakness: Secondary | ICD-10-CM | POA: Diagnosis not present

## 2017-01-02 DIAGNOSIS — C61 Malignant neoplasm of prostate: Secondary | ICD-10-CM | POA: Diagnosis present

## 2017-01-02 DIAGNOSIS — I10 Essential (primary) hypertension: Secondary | ICD-10-CM | POA: Diagnosis not present

## 2017-01-02 DIAGNOSIS — B91 Sequelae of poliomyelitis: Secondary | ICD-10-CM | POA: Diagnosis not present

## 2017-01-02 LAB — COMPREHENSIVE METABOLIC PANEL
ALBUMIN: 3.8 g/dL (ref 3.5–5.0)
ALK PHOS: 60 U/L (ref 38–126)
ALT: 21 U/L (ref 17–63)
AST: 23 U/L (ref 15–41)
Anion gap: 8 (ref 5–15)
BILIRUBIN TOTAL: 0.5 mg/dL (ref 0.3–1.2)
BUN: 18 mg/dL (ref 6–20)
CALCIUM: 8.8 mg/dL — AB (ref 8.9–10.3)
CO2: 24 mmol/L (ref 22–32)
Chloride: 107 mmol/L (ref 101–111)
Creatinine, Ser: 0.59 mg/dL — ABNORMAL LOW (ref 0.61–1.24)
GFR calc Af Amer: >60 mL/min (ref >60)
GFR calc non Af Amer: >60 mL/min (ref >60)
GLUCOSE: 98 mg/dL (ref 65–99)
POTASSIUM: 4.1 mmol/L (ref 3.5–5.1)
SODIUM: 139 mmol/L (ref 135–145)
TOTAL PROTEIN: 6.2 g/dL — AB (ref 6.5–8.1)

## 2017-01-02 LAB — CBC
HCT: 43.8 % (ref 39.0–52.0)
HEMOGLOBIN: 15.1 g/dL (ref 13.0–17.0)
MCH: 31.8 pg (ref 26.0–34.0)
MCHC: 34.5 g/dL (ref 30.0–36.0)
MCV: 92.2 fL (ref 78.0–100.0)
Platelets: 182 10*3/uL (ref 150–400)
RBC: 4.75 MIL/uL (ref 4.22–5.81)
RDW: 13.8 % (ref 11.5–15.5)
WBC: 6.8 10*3/uL (ref 4.0–10.5)

## 2017-01-02 LAB — APTT: aPTT: 31 seconds (ref 24–36)

## 2017-01-02 LAB — PROTIME-INR
INR: 1.02
Prothrombin Time: 13.4 seconds (ref 11.4–15.2)

## 2017-01-03 ENCOUNTER — Ambulatory Visit: Payer: Medicare Other

## 2017-01-04 ENCOUNTER — Ambulatory Visit: Payer: Medicare Other

## 2017-01-04 ENCOUNTER — Telehealth: Payer: Self-pay | Admitting: *Deleted

## 2017-01-04 NOTE — Telephone Encounter (Signed)
Called patient to remind of space oar procedure for 01-05-17, spoke with patient and he is aware of this procedure

## 2017-01-05 ENCOUNTER — Ambulatory Visit (HOSPITAL_COMMUNITY): Payer: Medicare Other

## 2017-01-05 ENCOUNTER — Ambulatory Visit (HOSPITAL_BASED_OUTPATIENT_CLINIC_OR_DEPARTMENT_OTHER): Payer: Medicare Other | Admitting: Anesthesiology

## 2017-01-05 ENCOUNTER — Encounter (HOSPITAL_BASED_OUTPATIENT_CLINIC_OR_DEPARTMENT_OTHER): Payer: Self-pay

## 2017-01-05 ENCOUNTER — Ambulatory Visit: Payer: Medicare Other

## 2017-01-05 ENCOUNTER — Ambulatory Visit (HOSPITAL_BASED_OUTPATIENT_CLINIC_OR_DEPARTMENT_OTHER)
Admission: RE | Admit: 2017-01-05 | Discharge: 2017-01-05 | Disposition: A | Discharge status: Home / Self Care | Payer: Medicare Other | Source: Ambulatory Visit | Attending: Urology | Admitting: Urology

## 2017-01-05 ENCOUNTER — Other Ambulatory Visit: Payer: Self-pay

## 2017-01-05 ENCOUNTER — Encounter (HOSPITAL_BASED_OUTPATIENT_CLINIC_OR_DEPARTMENT_OTHER)
Admission: RE | Disposition: A | Discharge status: Home / Self Care | Payer: Self-pay | Source: Ambulatory Visit | Attending: Urology

## 2017-01-05 DIAGNOSIS — Z87891 Personal history of nicotine dependence: Secondary | ICD-10-CM | POA: Insufficient documentation

## 2017-01-05 DIAGNOSIS — B91 Sequelae of poliomyelitis: Secondary | ICD-10-CM | POA: Diagnosis not present

## 2017-01-05 DIAGNOSIS — C61 Malignant neoplasm of prostate: Secondary | ICD-10-CM | POA: Insufficient documentation

## 2017-01-05 DIAGNOSIS — Z8546 Personal history of malignant neoplasm of prostate: Secondary | ICD-10-CM

## 2017-01-05 DIAGNOSIS — I10 Essential (primary) hypertension: Secondary | ICD-10-CM | POA: Insufficient documentation

## 2017-01-05 DIAGNOSIS — Z79899 Other long term (current) drug therapy: Secondary | ICD-10-CM | POA: Insufficient documentation

## 2017-01-05 DIAGNOSIS — Z7982 Long term (current) use of aspirin: Secondary | ICD-10-CM | POA: Insufficient documentation

## 2017-01-05 DIAGNOSIS — R531 Weakness: Secondary | ICD-10-CM | POA: Diagnosis not present

## 2017-01-05 DIAGNOSIS — Z01818 Encounter for other preprocedural examination: Secondary | ICD-10-CM

## 2017-01-05 HISTORY — DX: Essential (primary) hypertension: I10

## 2017-01-05 HISTORY — DX: Male erectile dysfunction, unspecified: N52.9

## 2017-01-05 HISTORY — PX: PROSTATE BIOPSY: SHX241

## 2017-01-05 HISTORY — DX: Cyst of kidney, acquired: N28.1

## 2017-01-05 HISTORY — DX: Presence of spectacles and contact lenses: Z97.3

## 2017-01-05 HISTORY — DX: Other neuromuscular dysfunction of bladder: N31.8

## 2017-01-05 HISTORY — DX: Dependence on other enabling machines and devices: Z99.89

## 2017-01-05 HISTORY — DX: Unspecified hearing loss, unspecified ear: H91.90

## 2017-01-05 HISTORY — DX: Postpolio syndrome: G14

## 2017-01-05 SURGERY — BIOPSY, PROSTATE
Anesthesia: General | Site: Prostate

## 2017-01-05 MED ORDER — FENTANYL CITRATE (PF) 100 MCG/2ML IJ SOLN
25.0000 ug | INTRAMUSCULAR | Status: DC | PRN
  Filled 2017-01-05: qty 1

## 2017-01-05 MED ORDER — FENTANYL CITRATE (PF) 100 MCG/2ML IJ SOLN
INTRAMUSCULAR | Status: DC | PRN
  Administered 2017-01-05: 50 ug via INTRAVENOUS
  Administered 2017-01-05 (×2): 25 ug via INTRAVENOUS

## 2017-01-05 MED ORDER — PROMETHAZINE HCL 25 MG/ML IJ SOLN
6.2500 mg | INTRAMUSCULAR | Status: DC | PRN
  Filled 2017-01-05: qty 1

## 2017-01-05 MED ORDER — LIDOCAINE 2% (20 MG/ML) 5 ML SYRINGE
INTRAMUSCULAR | Status: DC | PRN
  Administered 2017-01-05: 80 mg via INTRAVENOUS

## 2017-01-05 MED ORDER — LACTATED RINGERS IV SOLN
INTRAVENOUS | Status: DC
  Administered 2017-01-05 (×2): via INTRAVENOUS
  Filled 2017-01-05: qty 1000

## 2017-01-05 MED ORDER — PROPOFOL 10 MG/ML IV BOLUS
INTRAVENOUS | Status: AC
  Filled 2017-01-05: qty 20

## 2017-01-05 MED ORDER — LIDOCAINE 2% (20 MG/ML) 5 ML SYRINGE
INTRAMUSCULAR | Status: AC
  Filled 2017-01-05: qty 5

## 2017-01-05 MED ORDER — DEXAMETHASONE SODIUM PHOSPHATE 4 MG/ML IJ SOLN
INTRAMUSCULAR | Status: DC | PRN
  Administered 2017-01-05: 10 mg via INTRAVENOUS

## 2017-01-05 MED ORDER — ONDANSETRON HCL 4 MG/2ML IJ SOLN
INTRAMUSCULAR | Status: AC
  Filled 2017-01-05: qty 2

## 2017-01-05 MED ORDER — DEXAMETHASONE SODIUM PHOSPHATE 10 MG/ML IJ SOLN
INTRAMUSCULAR | Status: AC
  Filled 2017-01-05: qty 1

## 2017-01-05 MED ORDER — PHENYLEPHRINE 40 MCG/ML (10ML) SYRINGE FOR IV PUSH (FOR BLOOD PRESSURE SUPPORT)
PREFILLED_SYRINGE | INTRAVENOUS | Status: DC | PRN
  Administered 2017-01-05: 40 ug via INTRAVENOUS
  Administered 2017-01-05: 80 ug via INTRAVENOUS
  Administered 2017-01-05: 40 ug via INTRAVENOUS

## 2017-01-05 MED ORDER — PROPOFOL 10 MG/ML IV BOLUS
INTRAVENOUS | Status: DC | PRN
  Administered 2017-01-05: 150 mg via INTRAVENOUS
  Administered 2017-01-05: 50 mg via INTRAVENOUS

## 2017-01-05 MED ORDER — GENTAMICIN SULFATE 40 MG/ML IJ SOLN
5.0000 mg/kg | INTRAVENOUS | Status: AC
  Administered 2017-01-05: 370 mg via INTRAVENOUS
  Filled 2017-01-05 (×2): qty 9.25

## 2017-01-05 MED ORDER — SODIUM CHLORIDE 0.9 % IJ SOLN
INTRAMUSCULAR | Status: DC | PRN
  Administered 2017-01-05: 10 mL

## 2017-01-05 MED ORDER — FENTANYL CITRATE (PF) 100 MCG/2ML IJ SOLN
INTRAMUSCULAR | Status: AC
  Filled 2017-01-05: qty 2

## 2017-01-05 MED ORDER — GENTAMICIN SULFATE 40 MG/ML IJ SOLN
160.0000 mg | Freq: Once | INTRAVENOUS | Status: DC
  Filled 2017-01-05: qty 4

## 2017-01-05 MED ORDER — FLEET ENEMA 7-19 GM/118ML RE ENEM
1.0000 | ENEMA | Freq: Once | RECTAL | Status: DC
  Filled 2017-01-05: qty 1

## 2017-01-05 MED ORDER — ONDANSETRON HCL 4 MG/2ML IJ SOLN
INTRAMUSCULAR | Status: DC | PRN
  Administered 2017-01-05: 4 mg via INTRAVENOUS

## 2017-01-05 SURGICAL SUPPLY — 14 items
COVER BACK TABLE 60X90IN (DRAPES) ×3 IMPLANT
DRAPE LG THREE QUARTER DISP (DRAPES) ×3 IMPLANT
DRAPE UNDERBUTTOCKS STRL (DRAPE) ×3 IMPLANT
DRSG TEGADERM 4X4.75 (GAUZE/BANDAGES/DRESSINGS) ×3 IMPLANT
DRSG TEGADERM 8X12 (GAUZE/BANDAGES/DRESSINGS) ×3 IMPLANT
GAUZE SPONGE 4X4 3PLY NS LF (GAUZE/BANDAGES/DRESSINGS) ×3 IMPLANT
IMPL SPACEOAR SYSTEM 10ML (MISCELLANEOUS) ×1 IMPLANT
IMPLANT SPACEOAR SYSTEM 10ML (MISCELLANEOUS) ×3
KIT RM TURNOVER CYSTO AR (KITS) ×3 IMPLANT
LEGGING LITHOTOMY PAIR STRL (DRAPES) ×3 IMPLANT
MARKER PEN SURG W/LABELS BLK (STERILIZATION PRODUCTS) ×3 IMPLANT
SPONGE GAUZE 4X4 12PLY STER LF (GAUZE/BANDAGES/DRESSINGS) ×3 IMPLANT
SYR CONTROL 10ML LL (SYRINGE) ×3 IMPLANT
UNDERPAD 30X30 INCONTINENT (UNDERPADS AND DIAPERS) ×3 IMPLANT

## 2017-01-05 NOTE — Anesthesia Postprocedure Evaluation (Signed)
Anesthesia Post Note  Patient: Alan Rivera  Procedure(s) Performed: Procedure(s) (LRB): PROSTATE ULTRASOUND WITH SPACE OAR (N/A)  Patient location during evaluation: PACU Anesthesia Type: General Level of consciousness: awake and alert Pain management: pain level controlled Vital Signs Assessment: post-procedure vital signs reviewed and stable Respiratory status: spontaneous breathing, nonlabored ventilation, respiratory function stable and patient connected to nasal cannula oxygen Cardiovascular status: blood pressure returned to baseline and stable Postop Assessment: no signs of nausea or vomiting Anesthetic complications: no       Last Vitals:  Vitals:   01/05/17 1045 01/05/17 1100  BP: 125/75 115/86  Pulse: 70 64  Resp: 17 12  Temp:      Last Pain:  Vitals:   01/05/17 0836  TempSrc: Leonard Schwartz

## 2017-01-05 NOTE — Anesthesia Procedure Notes (Signed)
Procedure Name: LMA Insertion Date/Time: 01/05/2017 10:02 AM Performed by: Denna Haggard D Pre-anesthesia Checklist: Patient identified, Emergency Drugs available, Suction available and Patient being monitored Patient Re-evaluated:Patient Re-evaluated prior to inductionOxygen Delivery Method: Circle system utilized Preoxygenation: Pre-oxygenation with 100% oxygen Intubation Type: IV induction Ventilation: Mask ventilation without difficulty LMA: LMA inserted LMA Size: 4.0 Number of attempts: 1 Airway Equipment and Method: Bite block Placement Confirmation: positive ETCO2 Tube secured with: Tape Dental Injury: Teeth and Oropharynx as per pre-operative assessment

## 2017-01-05 NOTE — Transfer of Care (Signed)
Immediate Anesthesia Transfer of Care Note  Patient: Alan Rivera  Procedure(s) Performed: Procedure(s) (LRB): PROSTATE ULTRASOUND WITH SPACE OAR (N/A)  Patient Location: PACU  Anesthesia Type: General  Level of Consciousness: awake, oriented, sedated and patient cooperative  Airway & Oxygen Therapy: Patient Spontanous Breathing and Patient connected to face mask oxygen  Post-op Assessment: Report given to PACU RN and Post -op Vital signs reviewed and stable  Post vital signs: Reviewed and stable  Complications: No apparent anesthesia complications Last Vitals:  Vitals:   01/05/17 0836 01/05/17 1033  BP: 132/63 119/78  Pulse: 79 75  Resp: 16 (!) 6  Temp: 36.6 C

## 2017-01-05 NOTE — Discharge Instructions (Signed)
Transrectal Ultrasound Transrectal ultrasound is a procedure that uses sound waves to create images of your prostate gland and nearby tissues. The sound waves are sent through the wall of your rectum into your prostate gland, which is located in front of your rectum. The images show the size and shape of your prostate gland and nearby structures. You may have this test if you have:  Trouble urinating.  Infertility.  An abnormal prostate screening exam. Tell a health care provider about:  Any allergies you have.  All medicines you are taking, including vitamins, herbs, eye drops, creams, and over-the-counter medicines.  Any blood disorders you have.  Any medical conditions you have.  Any surgeries you have had. What are the risks? Generally, this is a safe procedure. However, problems may occur, including:  Discomfort during the procedure. This is rare.  Blood in your urine or sperm after the procedure. This is rare. What happens before the procedure?  Your health care provider may instruct you to use an enema 1-4 hours before the procedure. Follow instructions from your health care provider about how to do the enema.  Ask your health care provider about changing or stopping your regular medicines. This is especially important if you are taking diabetes medicines or blood thinners. What happens during the procedure?  You will be asked to lie down on your left side on an examination table.  You will bend your knees toward your chest.  A lubricated probe will be gently inserted into your rectum. This may cause a feeling of fullness.  The probe will send signals to a computer that will create images.  The technician will slightly rotate the probe throughout the procedure. While rotating the probe, he or she will view and capture images of the prostate gland and the surrounding structures from different angles.  The probe will be removed. The procedure may vary among health care  providers and hospitals. What happens after the procedure?  It is up to you to get the results of your procedure. Ask your health care provider, or the department that is doing the procedure, when your results will be ready. Summary  Transrectal ultrasound is a procedure that uses sound waves to create images of your prostate gland and nearby tissues.  The images show the size and shape of your prostate gland and nearby structures.  Before the procedure, ask your health care provider about changing or stopping your regular medicines. This is especially important if you are taking diabetes medicines or blood thinners. This information is not intended to replace advice given to you by your health care provider. Make sure you discuss any questions you have with your health care provider. Document Released: 08/02/2005 Document Revised: 09/15/2016 Document Reviewed: 09/15/2016 Elsevier Interactive Patient Education  2017 [employer].  Call your surgeon if you experience:   1.  Fever over 101.0. 2.  Inability to urinate. 3.  Nausea and/or vomiting. 4.  Extreme swelling or bruising at the surgical site. 5.  Continued bleeding from the site 6.  Increased pain, redness or drainage from the site 7.  Problems related to your pain medication. 8.  Any problems and/or concerns  Post Anesthesia Home Care Instructions  Activity: Get plenty of rest for the remainder of the day. A responsible adult should stay with you for 24 hours following the procedure.  For the next 24 hours, DO NOT: -Drive a car -Paediatric nurse -Drink alcoholic beverages -Take any medication unless instructed by your physician -Make any  legal decisions or sign important papers.  Meals: Start with liquid foods such as gelatin or soup. Progress to regular foods as tolerated. Avoid greasy, spicy, heavy foods. If nausea and/or vomiting occur, drink only clear liquids until the nausea and/or vomiting subsides. Call your  physician if vomiting continues.  Special Instructions/Symptoms: Your throat may feel dry or sore from the anesthesia or the breathing tube placed in your throat during surgery. If this causes discomfort, gargle with warm salt water. The discomfort should disappear within 24 hours.  If you had a scopolamine patch placed behind your ear for the management of post- operative nausea and/or vomiting:  1. The medication in the patch is effective for 72 hours, after which it should be removed.  Wrap patch in a tissue and discard in the trash. Wash hands thoroughly with soap and water. 2. You may remove the patch earlier than 72 hours if you experience unpleasant side effects which may include dry mouth, dizziness or visual disturbances. 3. Avoid touching the patch. Wash your hands with soap and water after contact with the patch.

## 2017-01-05 NOTE — Anesthesia Preprocedure Evaluation (Addendum)
Anesthesia Evaluation  Patient identified by MRN, date of birth, ID band Patient awake    Reviewed: Allergy & Precautions, NPO status , Patient's Chart, lab work & pertinent test results  Airway Mallampati: II  TM Distance: >3 FB Neck ROM: Full    Dental  (+) Dental Advisory Given   Pulmonary former smoker,    breath sounds clear to auscultation       Cardiovascular hypertension, Pt. 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)    GI/Hepatic negative GI ROS, Neg liver ROS,   Endo/Other  negative endocrine ROS  Renal/GU Renal disease   Prostate CA    Musculoskeletal   Abdominal   Peds  Hematology negative hematology ROS (+)   Anesthesia Other Findings   Reproductive/Obstetrics                            Anesthesia Physical Anesthesia Plan  ASA: II  Anesthesia Plan: General   Post-op Pain Management:    Induction: Intravenous  Airway Management Planned: LMA  Additional Equipment:   Intra-op Plan:   Post-operative Plan: Extubation in OR  Informed Consent: I have reviewed the patients History and Physical, chart, labs and discussed the procedure including the risks, benefits and alternatives for the proposed anesthesia with the patient or authorized representative who has indicated his/her understanding and acceptance.   Dental advisory given  Plan Discussed with: CRNA  Anesthesia Plan Comments:        Anesthesia Quick Evaluation

## 2017-01-05 NOTE — Brief Op Note (Signed)
01/05/2017  10:24 AM  PATIENT:  Alan Rivera  71 y.o. male  PRE-OPERATIVE DIAGNOSIS:  PROSTATE CANCER  POST-OPERATIVE DIAGNOSIS:  PROSTATE CANCER  PROCEDURE:  Procedure(s): PROSTATE ULTRASOUND WITH SPACE OAR (N/A)  SURGEON:  Surgeon(s) and Role:    * [name], MD - Primary    * Tyler Pita, MD - Assisting  PHYSICIAN ASSISTANT:   ASSISTANTS: none   ANESTHESIA:   general  EBL:  Total I/O In: 100 [I.V.:100] Out: -   BLOOD ADMINISTERED:none  DRAINS: none   LOCAL MEDICATIONS USED:  NONE  SPECIMEN:  No Specimen  DISPOSITION OF SPECIMEN:  N/A  COUNTS:  YES  TOURNIQUET:  * No tourniquets in log *  DICTATION: .Note written in EPIC  PLAN OF CARE: Discharge to home after PACU  PATIENT DISPOSITION:  PACU - hemodynamically stable.   Delay start of Pharmacological VTE agent (>24hrs) due to surgical blood loss or risk of bleeding: not applicable

## 2017-01-05 NOTE — H&P (Signed)
Urology Admission H&P  Chief Complaint: prostate cancer  History of Present Illness: Alan Rivera is a 71yo with high risk prostate cancer here for SpaceOAR implantation  Past Medical History:  Diagnosis Date  . Bilateral renal cysts   . ED (erectile dysfunction)   . Frequency-urgency syndrome   . HOH (hard of hearing)    left side  . Hypertension   . 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 Prostate cancer ([name]) UROLOGIST-  DR Kirin Brandenburger/  ONCOLOGIST -- [name]   dx 10/ 2017-- Stage T2a,  Gleason 4+5,  PSA 6.68, vol 43cc  . Uses walker    prn  . Wears glasses    Past Surgical History:  Procedure Laterality Date  . FOOT SURGERY Bilateral child   l related to effects of polio  . PROSTATE BIOPSY    . SHOULDER ARTHROSCOPY WITH OPEN ROTATOR CUFF REPAIR Bilateral right 04/30/2007;  left 10-15-2007   w/ Debridement/  DCR/  SAD/  Acromioplasty/  Bursectomy  . TONSILLECTOMY AND ADENOIDECTOMY      Home Medications:  Prescriptions Prior to Admission  Medication Sig Dispense Refill Last Dose  . cholecalciferol (VITAMIN D) 1000 units tablet Take 1,000 Units by mouth every other day.    Past Month at Unknown time  . lisinopril (PRINIVIL,ZESTRIL) 10 MG tablet Take 10 mg by mouth daily.   01/04/2017 at 1000  . omega-3 acid ethyl esters (LOVAZA) 1 g capsule Take 1 g by mouth daily.    Past Month at Unknown time  . vitamin B-12 (CYANOCOBALAMIN) 1000 MCG tablet Take 1,000 mcg by mouth daily.   Past Month at Unknown time  . alendronate (FOSAMAX) 70 MG tablet Take 70 mg by mouth once a week. Take with a full glass of water on an empty stomach.   01/03/2017 at 1000  . aspirin EC 81 MG tablet Take 81 mg by mouth daily. Taking two tablets per day.   01/03/2017 at 1000   Allergies:  Allergies  Allergen Reactions  . Shellfish  Allergy Anaphylaxis    Family History  Problem Relation Age of Onset  . Cancer Father     lung and prostate cancer   Social History:  reports that he quit smoking about 35 years ago. His smoking use included Cigarettes. He has a 36.00 pack-year smoking history. He has never used smokeless tobacco. He reports that he drinks about 2.4 oz of alcohol per week . He reports that he does not use drugs.  Review of Systems  All other systems reviewed and are negative.   Physical Exam:  Vital signs in last 24 hours: Temp:  [97.8 F (36.6 C)] 97.8 F (36.6 C) (03/02 0836) Pulse Rate:  [79] 79 (03/02 0836) Resp:  [16] 16 (03/02 0836) BP: (132)/(63) 132/63 (03/02 0836) SpO2:  [99 %] 99 % (03/02 0836) Weight:  [74.8 kg (165 lb)] 74.8 kg (165 lb) (03/02 0836) Physical Exam  Constitutional: He is oriented to person, place, and time. He appears well-developed and well-nourished.  HENT:  Head: Normocephalic and atraumatic.  Eyes: EOM are normal. Pupils are equal, round, and reactive to light.  Neck: Normal range of motion. No thyromegaly present.  Cardiovascular: Normal rate and regular rhythm.   Respiratory: No respiratory distress.  GI: Soft. He exhibits no distension.  Musculoskeletal: Normal range of  motion. He exhibits no edema.  Neurological: He is alert and oriented to person, place, and time.  Skin: Skin is warm and dry.  Psychiatric: He has a normal mood and affect. His behavior is normal. Judgment and thought content normal.    Laboratory Data:  No results found for this or any previous visit (from the past 24 hour(s)). No results found for this or any previous visit (from the past 240 hour(s)). Creatinine:  Recent Labs  01/02/17 1311  CREATININE 0.59*   Baseline Creatinine: 0.6  Impression/Assessment:  Alan Rivera with prostate cancer  Plan:  The risks/benefits/alternatives to [place] placement was explained to the patient and he understands and wishes to proceed with  surgery  Nicolette Bang 01/05/2017, 9:10 AM

## 2017-01-08 ENCOUNTER — Encounter (HOSPITAL_BASED_OUTPATIENT_CLINIC_OR_DEPARTMENT_OTHER): Payer: Self-pay | Admitting: Urology

## 2017-01-08 ENCOUNTER — Ambulatory Visit: Payer: Medicare Other

## 2017-01-09 ENCOUNTER — Ambulatory Visit: Payer: Medicare Other

## 2017-01-10 ENCOUNTER — Ambulatory Visit: Payer: Medicare Other

## 2017-01-11 ENCOUNTER — Ambulatory Visit: Payer: Medicare Other

## 2017-01-12 ENCOUNTER — Ambulatory Visit (HOSPITAL_COMMUNITY)
Admission: RE | Admit: 2017-01-12 | Discharge: 2017-01-12 | Disposition: A | Discharge status: Home / Self Care | Payer: Medicare Other | Source: Ambulatory Visit | Attending: Urology | Admitting: Urology

## 2017-01-12 ENCOUNTER — Ambulatory Visit: Payer: Medicare Other

## 2017-01-12 ENCOUNTER — Ambulatory Visit
Admission: RE | Admit: 2017-01-12 | Discharge: 2017-01-12 | Disposition: A | Discharge status: Home / Self Care | Payer: Medicare Other | Source: Ambulatory Visit | Attending: Radiation Oncology | Admitting: Radiation Oncology

## 2017-01-12 DIAGNOSIS — C61 Malignant neoplasm of prostate: Secondary | ICD-10-CM

## 2017-01-12 DIAGNOSIS — Z510 Encounter for antineoplastic radiation therapy: Secondary | ICD-10-CM | POA: Insufficient documentation

## 2017-01-12 DIAGNOSIS — R300 Dysuria: Secondary | ICD-10-CM | POA: Diagnosis not present

## 2017-01-12 NOTE — Addendum Note (Signed)
Encounter addended by: Tyler Pita, MD on: 01/12/2017 10:47 AM<BR>    Actions taken: Problem List reviewed, Sign clinical note

## 2017-01-12 NOTE — Progress Notes (Addendum)
  Radiation Oncology         [phone]) [phone] ________________________________  Name: Alan Rivera MRN: [id number]  Date: 01/12/2017  DOB: 22-Jan-1946  SIMULATION AND TREATMENT PLANNING NOTE    ICD-9-CM ICD-10-CM   1. Malignant neoplasm of prostate ([place]) 185 C61     DIAGNOSIS: 71 y.o. gentleman with stage T2a adenocarcinoma of the prostate with a Gleason's score of 4+5 and a PSA of 6.68  NARRATIVE:  The patient was brought to the [facility].  Identity was confirmed.  All relevant records and images related to the planned course of therapy were reviewed.  The patient freely provided informed written consent to proceed with treatment after reviewing the details related to the planned course of therapy. The consent form was witnessed and verified by the simulation staff.  Then, the patient was set-up in a stable reproducible supine position for radiation therapy.  A vacuum lock pillow device was custom fabricated to position his legs in a reproducible immobilized position.  Then, I performed a urethrogram under sterile conditions to identify the prostatic apex.  CT images were obtained.  Surface markings were placed.  The CT images were loaded into the planning software.  Then the prostate target and avoidance structures including the rectum, bladder, bowel and hips were contoured.  Treatment planning then occurred.  The radiation prescription was entered and confirmed.  A total of one complex treatment devices were fabricated. I have requested : Intensity Modulated Radiotherapy (IMRT) is medically necessary for this case for the following reason:  Rectal sparing.  He has had SpaceOAR hydrogel placed by urology between the rectum and prostate to reduce rectal exposure.  Given the properties of SpaceOAR, the patient will proceed to MRI this morning to image the hydrogel prior to planning.  PLAN:  The patient will receive 75 Gy in 40 fractions with 45 Gy to the pelvis then  boost.  ________________________________  Sheral Apley. Tammi Klippel, M.D.  This document serves as a record of services personally performed by Tyler Pita, MD. It was created on his behalf by Darcus Austin, a trained medical scribe. The creation of this record is based on the scribe's personal observations and the provider's statements to them. This document has been checked and approved by the attending provider.

## 2017-01-15 ENCOUNTER — Ambulatory Visit: Payer: Medicare Other

## 2017-01-15 NOTE — Op Note (Signed)
PRE-OPERATIVE DIAGNOSIS:  Adenocarcinoma of the prostate  POST-OPERATIVE DIAGNOSIS:  Same  PROCEDURE:  Placement of SpaceOAR  SURGEON:  Surgeon(s): Nicolette Bang, MD  ANESTHESIA:  General  EBL:  Minimal  DRAINS: none  INDICATION: Wallie Lagrand is a 71 year old with a history of T1c prostate cancer who is scheduled to undergo IMRT. He wishes to have [place] placed prior to IMRT to decrease rectal toxicity.  Description of procedure: After informed consent the patient was brought to the major OR, placed on the table and administered general anesthesia. He was then moved to the modified lithotomy position with his perineum perpendicular to the floor. His perineum and genitalia were then sterilely prepped. An official timeout was then performed. The transrectal ultrasound probe was placed in the rectum and affixed to the stand. He was then sterilely draped. We then proceeded to mix the SpaceOAR using the kit supplied from the manufacturer. Once this was complete we placed a sinal needle into the perirectal fat between the rectum and the prostate. Once this was accomplished we injected 2cc of normal saline to hydrodissect the plain. We then instilled the the SpaceOAR through the spinal needle and noted good distribution in the perirectal fat.  The patient was awakened and taken to recovery room in stable and satisfactory condition. He tolerated procedure well and there were no intraoperative complications.

## 2017-01-16 ENCOUNTER — Ambulatory Visit: Payer: Medicare Other

## 2017-01-17 ENCOUNTER — Ambulatory Visit: Payer: Medicare Other

## 2017-01-18 ENCOUNTER — Ambulatory Visit: Payer: Medicare Other

## 2017-01-19 ENCOUNTER — Ambulatory Visit: Payer: Medicare Other

## 2017-01-19 DIAGNOSIS — Z510 Encounter for antineoplastic radiation therapy: Secondary | ICD-10-CM | POA: Diagnosis not present

## 2017-01-22 ENCOUNTER — Ambulatory Visit: Payer: Medicare Other

## 2017-01-23 ENCOUNTER — Encounter: Payer: Self-pay | Admitting: Medical Oncology

## 2017-01-23 ENCOUNTER — Ambulatory Visit
Admission: RE | Admit: 2017-01-23 | Discharge: 2017-01-23 | Disposition: A | Discharge status: Home / Self Care | Payer: Medicare Other | Source: Ambulatory Visit | Attending: Radiation Oncology | Admitting: Radiation Oncology

## 2017-01-23 ENCOUNTER — Ambulatory Visit: Payer: Medicare Other

## 2017-01-23 DIAGNOSIS — Z510 Encounter for antineoplastic radiation therapy: Secondary | ICD-10-CM | POA: Diagnosis not present

## 2017-01-23 NOTE — Progress Notes (Signed)
Alan Rivera here for first radiation treatment. We discussed having a comfortable full bladder during treatments. I explained this might take a few days to get this down. All questions were answered and will continue to follow.

## 2017-01-24 ENCOUNTER — Ambulatory Visit
Admission: RE | Admit: 2017-01-24 | Discharge: 2017-01-24 | Disposition: A | Discharge status: Home / Self Care | Payer: Medicare Other | Source: Ambulatory Visit | Attending: Radiation Oncology | Admitting: Radiation Oncology

## 2017-01-24 DIAGNOSIS — Z510 Encounter for antineoplastic radiation therapy: Secondary | ICD-10-CM | POA: Diagnosis not present

## 2017-01-25 ENCOUNTER — Ambulatory Visit
Admission: RE | Admit: 2017-01-25 | Discharge: 2017-01-25 | Disposition: A | Discharge status: Home / Self Care | Payer: Medicare Other | Source: Ambulatory Visit | Attending: Radiation Oncology | Admitting: Radiation Oncology

## 2017-01-25 DIAGNOSIS — Z510 Encounter for antineoplastic radiation therapy: Secondary | ICD-10-CM | POA: Diagnosis not present

## 2017-01-26 ENCOUNTER — Ambulatory Visit
Admission: RE | Admit: 2017-01-26 | Discharge: 2017-01-26 | Disposition: A | Discharge status: Home / Self Care | Payer: Medicare Other | Source: Ambulatory Visit | Attending: Radiation Oncology | Admitting: Radiation Oncology

## 2017-01-26 DIAGNOSIS — Z510 Encounter for antineoplastic radiation therapy: Secondary | ICD-10-CM | POA: Diagnosis not present

## 2017-01-29 ENCOUNTER — Ambulatory Visit
Admission: RE | Admit: 2017-01-29 | Discharge: 2017-01-29 | Disposition: A | Discharge status: Home / Self Care | Payer: Medicare Other | Source: Ambulatory Visit | Attending: Radiation Oncology | Admitting: Radiation Oncology

## 2017-01-29 DIAGNOSIS — Z510 Encounter for antineoplastic radiation therapy: Secondary | ICD-10-CM | POA: Diagnosis not present

## 2017-01-30 ENCOUNTER — Ambulatory Visit
Admission: RE | Admit: 2017-01-30 | Discharge: 2017-01-30 | Disposition: A | Discharge status: Home / Self Care | Payer: Medicare Other | Source: Ambulatory Visit | Attending: Radiation Oncology | Admitting: Radiation Oncology

## 2017-01-30 DIAGNOSIS — Z510 Encounter for antineoplastic radiation therapy: Secondary | ICD-10-CM | POA: Diagnosis not present

## 2017-01-31 ENCOUNTER — Ambulatory Visit: Payer: Medicare Other | Admitting: Nutrition

## 2017-01-31 ENCOUNTER — Ambulatory Visit
Admission: RE | Admit: 2017-01-31 | Discharge: 2017-01-31 | Disposition: A | Discharge status: Home / Self Care | Payer: Medicare Other | Source: Ambulatory Visit | Attending: Radiation Oncology | Admitting: Radiation Oncology

## 2017-01-31 DIAGNOSIS — Z510 Encounter for antineoplastic radiation therapy: Secondary | ICD-10-CM | POA: Diagnosis not present

## 2017-01-31 NOTE — Progress Notes (Signed)
71 year old male diagnosed with prostate cancer receiving IM RT  Past medical history includes polio, hypertension, and hard of hearing.  Medications include vitamin D, and vitamin B12.  Labs include albumin 3.8.  Height: 67 inches. Weight: 165 pounds March 2. BMI: 25.84.  Patient reports he has changed his diet to Vegan. He would like feedback on appropriate distribution of macronutrients. Patient denies nutrition impact symptoms from radiation therapy.  Nutrition diagnosis:  Food and nutrition related knowledge deficit related to prostate cancer and associated diet changes as evidenced by no prior need for nutrition related information.  Intervention: I educated patient on appropriate nutrient distribution for Vegan diet to include adequate protein for healing. Brief education provided on the sources of protein. Encouraged patient to continue taking vitamin B12 and have iron levels checked as these can be low in people who follow Vegan diet. Brief education provided.  If patient develops diarrhea Questions were answered.  Teach back method used.  Fact sheets were provided.  Contact information was given.  Monitoring, evaluation, goals:  Patient will tolerate adequate calories and protein for weight maintenance and appropriate healing.  Next visit: Patient will contact me if he has further questions or concerns.  **Disclaimer: This note was dictated with voice recognition software. Similar sounding words can inadvertently be transcribed and this note may contain transcription errors which may not have been corrected upon publication of note.**

## 2017-02-01 ENCOUNTER — Ambulatory Visit
Admission: RE | Admit: 2017-02-01 | Discharge: 2017-02-01 | Disposition: A | Discharge status: Home / Self Care | Payer: Medicare Other | Source: Ambulatory Visit | Attending: Radiation Oncology | Admitting: Radiation Oncology

## 2017-02-01 DIAGNOSIS — Z510 Encounter for antineoplastic radiation therapy: Secondary | ICD-10-CM | POA: Diagnosis not present

## 2017-02-02 ENCOUNTER — Ambulatory Visit
Admission: RE | Admit: 2017-02-02 | Discharge: 2017-02-02 | Disposition: A | Discharge status: Home / Self Care | Payer: Medicare Other | Source: Ambulatory Visit | Attending: Radiation Oncology | Admitting: Radiation Oncology

## 2017-02-02 DIAGNOSIS — Z510 Encounter for antineoplastic radiation therapy: Secondary | ICD-10-CM | POA: Diagnosis not present

## 2017-02-05 ENCOUNTER — Encounter: Payer: Self-pay | Admitting: Medical Oncology

## 2017-02-05 ENCOUNTER — Ambulatory Visit
Admission: RE | Admit: 2017-02-05 | Discharge: 2017-02-05 | Disposition: A | Discharge status: Home / Self Care | Payer: Medicare Other | Source: Ambulatory Visit | Attending: Radiation Oncology | Admitting: Radiation Oncology

## 2017-02-05 DIAGNOSIS — Z510 Encounter for antineoplastic radiation therapy: Secondary | ICD-10-CM | POA: Diagnosis not present

## 2017-02-06 ENCOUNTER — Ambulatory Visit
Admission: RE | Admit: 2017-02-06 | Discharge: 2017-02-06 | Disposition: A | Discharge status: Home / Self Care | Payer: Medicare Other | Source: Ambulatory Visit | Attending: Radiation Oncology | Admitting: Radiation Oncology

## 2017-02-06 DIAGNOSIS — Z510 Encounter for antineoplastic radiation therapy: Secondary | ICD-10-CM | POA: Diagnosis not present

## 2017-02-07 ENCOUNTER — Ambulatory Visit
Admission: RE | Admit: 2017-02-07 | Discharge: 2017-02-07 | Disposition: A | Discharge status: Home / Self Care | Payer: Medicare Other | Source: Ambulatory Visit | Attending: Radiation Oncology | Admitting: Radiation Oncology

## 2017-02-07 DIAGNOSIS — Z510 Encounter for antineoplastic radiation therapy: Secondary | ICD-10-CM | POA: Diagnosis not present

## 2017-02-08 ENCOUNTER — Ambulatory Visit
Admission: RE | Admit: 2017-02-08 | Discharge: 2017-02-08 | Disposition: A | Discharge status: Home / Self Care | Payer: Medicare Other | Source: Ambulatory Visit | Attending: Radiation Oncology | Admitting: Radiation Oncology

## 2017-02-08 DIAGNOSIS — Z510 Encounter for antineoplastic radiation therapy: Secondary | ICD-10-CM | POA: Diagnosis not present

## 2017-02-09 ENCOUNTER — Ambulatory Visit
Admission: RE | Admit: 2017-02-09 | Discharge: 2017-02-09 | Disposition: A | Discharge status: Home / Self Care | Payer: Medicare Other | Source: Ambulatory Visit | Attending: Radiation Oncology | Admitting: Radiation Oncology

## 2017-02-09 ENCOUNTER — Telehealth: Payer: Self-pay | Admitting: Radiation Oncology

## 2017-02-09 VITALS — BP 110/78 | HR 71 | Resp 18 | Wt 168.4 lb

## 2017-02-09 DIAGNOSIS — C61 Malignant neoplasm of prostate: Secondary | ICD-10-CM

## 2017-02-09 DIAGNOSIS — R300 Dysuria: Secondary | ICD-10-CM

## 2017-02-09 DIAGNOSIS — Z510 Encounter for antineoplastic radiation therapy: Secondary | ICD-10-CM | POA: Diagnosis not present

## 2017-02-09 LAB — URINALYSIS, MICROSCOPIC - CHCC
Bilirubin (Urine): NEGATIVE
Blood: NEGATIVE
GLUCOSE UR CHCC: NEGATIVE mg/dL
KETONES: NEGATIVE mg/dL
LEUKOCYTE ESTERASE: NEGATIVE
Nitrite: NEGATIVE
PH: 6.5 (ref 4.6–8.0)
PROTEIN: NEGATIVE mg/dL
RBC / HPF: NEGATIVE (ref 0–2)
Specific Gravity, Urine: 1.005 (ref 1.003–1.035)
UROBILINOGEN UR: 0.2 mg/dL (ref 0.2–1)
WBC, UA: NEGATIVE (ref 0–?)

## 2017-02-09 NOTE — Progress Notes (Signed)
Weight and vitals stable. Denies pain. Reports nocturia every hour during the night. Reports dysuria continues. Denies hematuria. Reports urinary hesitancy with weak stream and difficulty emptying his bladder. Denies leakage or incontinence. Denies any bowel complaints. Denies fatigue. Reports he continues to take a mid afternoon nap.   BP 110/78 (BP Location: Left Arm, Patient Position: Sitting, Cuff Size: Normal)   Pulse 71   Resp 18   Wt 168 lb 6.4 oz (76.4 kg)   SpO2 100%   BMI 26.38 kg/m  Wt Readings from Last 3 Encounters:  02/09/17 168 lb 6.4 oz (76.4 kg)  01/05/17 165 lb (74.8 kg)  09/18/16 164 lb 12.8 oz (74.8 kg)

## 2017-02-09 NOTE — Progress Notes (Signed)
  Radiation Oncology         ([phone]) [phone] ________________________________  Name: Alan Rivera MRN: [id number]  Date: 02/09/2017  DOB: 1946/10/20     Weekly Radiation Therapy Management    ICD-9-CM ICD-10-CM   1. Dysuria 788.1 R30.0 Urinalysis, Microscopic - [facility]     Urine culture  2. Malignant neoplasm of prostate ([facility]) 185 C61      Current Dose: 25.2 Gy     Planned Dose:  75 Gy  Narrative . . . . . . . . The patient presents for routine under treatment assessment.                                 Weight and vitals stable. Denies pain. Reports nocturia every hour during the night. Reports dysuria continues during the entire stream. Denies hematuria. Reports urinary hesitancy with weak stream and difficulty emptying his bladder. Denies leakage or incontinence. Denies any bowel complaints. Denies fatigue. Reports he continues to take a mid afternoon nap.                                 Set-up films were reviewed.                                 The chart was checked. Physical Findings. . .  weight is 168 lb 6.4 oz (76.4 kg). His blood pressure is 110/78 and his pulse is 71. His respiration is 18 and oxygen saturation is 100%. . Weight essentially stable.  No significant changes. Patient uses a walker to ambulate. Lungs are clear to auscultation bilaterally. Heart has regular rate and rhythm. Abdomen soft, non-tender, normal bowel sounds. Impression . . . . . . . The patient is tolerating radiation. Plan . . . . . . . . . . . . Continue treatment as planned. He will undergo a UA with culture after our visit today to check for infection.   ________________________________   Blair Promise, PhD, MD  This document serves as a record of services personally performed by Gery Pray, MD and Shona Simpson, PA-C. It was created on their behalf by Arlyce Harman, a trained medical scribe. The creation of this record is based on the scribe's personal observations and the provider's  statements to them. This document has been checked and approved by the attending provider.

## 2017-02-09 NOTE — Telephone Encounter (Signed)
I called the patient to let know there does not appear to be any findings to support the use of ABX since there does not appear to be concern for findings of a UTI. I will await culture results and act upon that with antibiotics as indicated.

## 2017-02-11 LAB — URINE CULTURE: ORGANISM ID, BACTERIA: NO GROWTH

## 2017-02-12 ENCOUNTER — Telehealth: Payer: Self-pay | Admitting: Radiation Oncology

## 2017-02-12 ENCOUNTER — Ambulatory Visit
Admission: RE | Admit: 2017-02-12 | Discharge: 2017-02-12 | Disposition: A | Discharge status: Home / Self Care | Payer: Medicare Other | Source: Ambulatory Visit | Attending: Radiation Oncology | Admitting: Radiation Oncology

## 2017-02-12 DIAGNOSIS — Z510 Encounter for antineoplastic radiation therapy: Secondary | ICD-10-CM | POA: Diagnosis not present

## 2017-02-12 NOTE — Telephone Encounter (Signed)
Phoned patient as directed by Shona Simpson, PA-C. Explained urine culture was negative for bacteria thus, no antibiotics are needed at this time. Explained that symptoms are likely due to treatment. Explained that [place] maybe helpful for dysuria. Patient verbalized understanding of all reviewed.

## 2017-02-12 NOTE — Telephone Encounter (Signed)
-----   Message from Hayden Pedro, PA-C sent at 02/12/2017  7:38 AM EDT ----- No need for abx, symptoms are likely just due to treatment. Could consider azo  ----- Message ----- From: Interface, Lab In Three Zero One Sent: 02/09/2017  11:54 AM To: Hayden Pedro, PA-C

## 2017-02-13 ENCOUNTER — Ambulatory Visit
Admission: RE | Admit: 2017-02-13 | Discharge: 2017-02-13 | Disposition: A | Discharge status: Home / Self Care | Payer: Medicare Other | Source: Ambulatory Visit | Attending: Radiation Oncology | Admitting: Radiation Oncology

## 2017-02-13 DIAGNOSIS — Z510 Encounter for antineoplastic radiation therapy: Secondary | ICD-10-CM | POA: Diagnosis not present

## 2017-02-14 ENCOUNTER — Ambulatory Visit
Admission: RE | Admit: 2017-02-14 | Discharge: 2017-02-14 | Disposition: A | Discharge status: Home / Self Care | Payer: Medicare Other | Source: Ambulatory Visit | Attending: Radiation Oncology | Admitting: Radiation Oncology

## 2017-02-14 DIAGNOSIS — Z510 Encounter for antineoplastic radiation therapy: Secondary | ICD-10-CM | POA: Diagnosis not present

## 2017-02-15 ENCOUNTER — Ambulatory Visit
Admission: RE | Admit: 2017-02-15 | Discharge: 2017-02-15 | Disposition: A | Discharge status: Home / Self Care | Payer: Medicare Other | Source: Ambulatory Visit | Attending: Radiation Oncology | Admitting: Radiation Oncology

## 2017-02-15 DIAGNOSIS — Z510 Encounter for antineoplastic radiation therapy: Secondary | ICD-10-CM | POA: Diagnosis not present

## 2017-02-16 ENCOUNTER — Ambulatory Visit
Admission: RE | Admit: 2017-02-16 | Discharge: 2017-02-16 | Disposition: A | Discharge status: Home / Self Care | Payer: Medicare Other | Source: Ambulatory Visit | Attending: Radiation Oncology | Admitting: Radiation Oncology

## 2017-02-16 DIAGNOSIS — Z510 Encounter for antineoplastic radiation therapy: Secondary | ICD-10-CM | POA: Diagnosis not present

## 2017-02-19 ENCOUNTER — Ambulatory Visit
Admission: RE | Admit: 2017-02-19 | Discharge: 2017-02-19 | Disposition: A | Discharge status: Home / Self Care | Payer: Medicare Other | Source: Ambulatory Visit | Attending: Radiation Oncology | Admitting: Radiation Oncology

## 2017-02-19 DIAGNOSIS — Z510 Encounter for antineoplastic radiation therapy: Secondary | ICD-10-CM | POA: Diagnosis not present

## 2017-02-20 ENCOUNTER — Ambulatory Visit
Admission: RE | Admit: 2017-02-20 | Discharge: 2017-02-20 | Disposition: A | Discharge status: Home / Self Care | Payer: Medicare Other | Source: Ambulatory Visit | Attending: Radiation Oncology | Admitting: Radiation Oncology

## 2017-02-21 ENCOUNTER — Ambulatory Visit
Admission: RE | Admit: 2017-02-21 | Discharge: 2017-02-21 | Disposition: A | Discharge status: Home / Self Care | Payer: Medicare Other | Source: Ambulatory Visit | Attending: Radiation Oncology | Admitting: Radiation Oncology

## 2017-02-21 DIAGNOSIS — Z510 Encounter for antineoplastic radiation therapy: Secondary | ICD-10-CM | POA: Diagnosis not present

## 2017-02-22 ENCOUNTER — Ambulatory Visit
Admission: RE | Admit: 2017-02-22 | Discharge: 2017-02-22 | Disposition: A | Discharge status: Home / Self Care | Payer: Medicare Other | Source: Ambulatory Visit | Attending: Radiation Oncology | Admitting: Radiation Oncology

## 2017-02-22 DIAGNOSIS — Z510 Encounter for antineoplastic radiation therapy: Secondary | ICD-10-CM | POA: Diagnosis not present

## 2017-02-23 ENCOUNTER — Ambulatory Visit
Admission: RE | Admit: 2017-02-23 | Discharge: 2017-02-23 | Disposition: A | Discharge status: Home / Self Care | Payer: Medicare Other | Source: Ambulatory Visit | Attending: Radiation Oncology | Admitting: Radiation Oncology

## 2017-02-23 ENCOUNTER — Encounter: Payer: Self-pay | Admitting: Medical Oncology

## 2017-02-23 DIAGNOSIS — Z510 Encounter for antineoplastic radiation therapy: Secondary | ICD-10-CM | POA: Diagnosis not present

## 2017-02-26 ENCOUNTER — Ambulatory Visit: Payer: Medicare Other

## 2017-02-27 ENCOUNTER — Ambulatory Visit
Admission: RE | Admit: 2017-02-27 | Discharge: 2017-02-27 | Disposition: A | Discharge status: Home / Self Care | Payer: Medicare Other | Source: Ambulatory Visit | Attending: Radiation Oncology | Admitting: Radiation Oncology

## 2017-02-27 DIAGNOSIS — Z510 Encounter for antineoplastic radiation therapy: Secondary | ICD-10-CM | POA: Diagnosis not present

## 2017-02-27 NOTE — Progress Notes (Signed)
Alan Rivera states he is doing well with radiation. No major complaints thus far. Will continue to follow.

## 2017-02-28 ENCOUNTER — Ambulatory Visit
Admission: RE | Admit: 2017-02-28 | Discharge: 2017-02-28 | Disposition: A | Discharge status: Home / Self Care | Payer: Medicare Other | Source: Ambulatory Visit | Attending: Radiation Oncology | Admitting: Radiation Oncology

## 2017-02-28 DIAGNOSIS — Z510 Encounter for antineoplastic radiation therapy: Secondary | ICD-10-CM | POA: Diagnosis not present

## 2017-03-01 ENCOUNTER — Encounter: Payer: Self-pay | Admitting: Medical Oncology

## 2017-03-01 ENCOUNTER — Ambulatory Visit
Admission: RE | Admit: 2017-03-01 | Discharge: 2017-03-01 | Disposition: A | Discharge status: Home / Self Care | Payer: Medicare Other | Source: Ambulatory Visit | Attending: Radiation Oncology | Admitting: Radiation Oncology

## 2017-03-01 DIAGNOSIS — Z510 Encounter for antineoplastic radiation therapy: Secondary | ICD-10-CM | POA: Diagnosis not present

## 2017-03-02 ENCOUNTER — Ambulatory Visit
Admission: RE | Admit: 2017-03-02 | Discharge: 2017-03-02 | Disposition: A | Discharge status: Home / Self Care | Payer: Medicare Other | Source: Ambulatory Visit | Attending: Radiation Oncology | Admitting: Radiation Oncology

## 2017-03-02 DIAGNOSIS — Z510 Encounter for antineoplastic radiation therapy: Secondary | ICD-10-CM | POA: Diagnosis not present

## 2017-03-05 ENCOUNTER — Ambulatory Visit
Admission: RE | Admit: 2017-03-05 | Discharge: 2017-03-05 | Disposition: A | Discharge status: Home / Self Care | Payer: Medicare Other | Source: Ambulatory Visit | Attending: Radiation Oncology | Admitting: Radiation Oncology

## 2017-03-05 DIAGNOSIS — Z510 Encounter for antineoplastic radiation therapy: Secondary | ICD-10-CM | POA: Diagnosis not present

## 2017-03-06 ENCOUNTER — Ambulatory Visit
Admission: RE | Admit: 2017-03-06 | Discharge: 2017-03-06 | Disposition: A | Discharge status: Home / Self Care | Payer: Medicare Other | Source: Ambulatory Visit | Attending: Radiation Oncology | Admitting: Radiation Oncology

## 2017-03-06 DIAGNOSIS — Z510 Encounter for antineoplastic radiation therapy: Secondary | ICD-10-CM | POA: Diagnosis not present

## 2017-03-07 ENCOUNTER — Ambulatory Visit
Admission: RE | Admit: 2017-03-07 | Discharge: 2017-03-07 | Disposition: A | Discharge status: Home / Self Care | Payer: Medicare Other | Source: Ambulatory Visit | Attending: Radiation Oncology | Admitting: Radiation Oncology

## 2017-03-07 DIAGNOSIS — Z510 Encounter for antineoplastic radiation therapy: Secondary | ICD-10-CM | POA: Diagnosis not present

## 2017-03-08 ENCOUNTER — Ambulatory Visit
Admission: RE | Admit: 2017-03-08 | Discharge: 2017-03-08 | Disposition: A | Discharge status: Home / Self Care | Payer: Medicare Other | Source: Ambulatory Visit | Attending: Radiation Oncology | Admitting: Radiation Oncology

## 2017-03-08 DIAGNOSIS — Z510 Encounter for antineoplastic radiation therapy: Secondary | ICD-10-CM | POA: Diagnosis not present

## 2017-03-09 ENCOUNTER — Ambulatory Visit
Admission: RE | Admit: 2017-03-09 | Discharge: 2017-03-09 | Disposition: A | Discharge status: Home / Self Care | Payer: Medicare Other | Source: Ambulatory Visit | Attending: Radiation Oncology | Admitting: Radiation Oncology

## 2017-03-09 DIAGNOSIS — Z510 Encounter for antineoplastic radiation therapy: Secondary | ICD-10-CM | POA: Diagnosis not present

## 2017-03-12 ENCOUNTER — Ambulatory Visit
Admission: RE | Admit: 2017-03-12 | Discharge: 2017-03-12 | Disposition: A | Discharge status: Home / Self Care | Payer: Medicare Other | Source: Ambulatory Visit | Attending: Radiation Oncology | Admitting: Radiation Oncology

## 2017-03-12 DIAGNOSIS — Z510 Encounter for antineoplastic radiation therapy: Secondary | ICD-10-CM | POA: Diagnosis not present

## 2017-03-13 ENCOUNTER — Ambulatory Visit
Admission: RE | Admit: 2017-03-13 | Discharge: 2017-03-13 | Disposition: A | Discharge status: Home / Self Care | Payer: Medicare Other | Source: Ambulatory Visit | Attending: Radiation Oncology | Admitting: Radiation Oncology

## 2017-03-13 DIAGNOSIS — Z510 Encounter for antineoplastic radiation therapy: Secondary | ICD-10-CM | POA: Diagnosis not present

## 2017-03-14 ENCOUNTER — Ambulatory Visit
Admission: RE | Admit: 2017-03-14 | Discharge: 2017-03-14 | Disposition: A | Discharge status: Home / Self Care | Payer: Medicare Other | Source: Ambulatory Visit | Attending: Radiation Oncology | Admitting: Radiation Oncology

## 2017-03-14 DIAGNOSIS — Z510 Encounter for antineoplastic radiation therapy: Secondary | ICD-10-CM | POA: Diagnosis not present

## 2017-03-15 ENCOUNTER — Other Ambulatory Visit: Payer: Self-pay | Admitting: Radiation Oncology

## 2017-03-15 ENCOUNTER — Ambulatory Visit
Admission: RE | Admit: 2017-03-15 | Discharge: 2017-03-15 | Disposition: A | Discharge status: Home / Self Care | Payer: Medicare Other | Source: Ambulatory Visit | Attending: Radiation Oncology | Admitting: Radiation Oncology

## 2017-03-15 DIAGNOSIS — C61 Malignant neoplasm of prostate: Secondary | ICD-10-CM

## 2017-03-15 DIAGNOSIS — Z510 Encounter for antineoplastic radiation therapy: Secondary | ICD-10-CM | POA: Diagnosis not present

## 2017-03-15 MED ORDER — TAMSULOSIN HCL 0.4 MG PO CAPS: 0.4000 mg | ORAL_CAPSULE | Freq: Every day | ORAL | 5 refills | Status: DC

## 2017-03-16 ENCOUNTER — Ambulatory Visit
Admission: RE | Admit: 2017-03-16 | Discharge: 2017-03-16 | Disposition: A | Discharge status: Home / Self Care | Payer: Medicare Other | Source: Ambulatory Visit | Attending: Radiation Oncology | Admitting: Radiation Oncology

## 2017-03-16 DIAGNOSIS — Z510 Encounter for antineoplastic radiation therapy: Secondary | ICD-10-CM | POA: Diagnosis not present

## 2017-03-19 ENCOUNTER — Ambulatory Visit: Payer: Medicare Other

## 2017-03-19 ENCOUNTER — Ambulatory Visit
Admission: RE | Admit: 2017-03-19 | Discharge: 2017-03-19 | Disposition: A | Discharge status: Home / Self Care | Payer: Medicare Other | Source: Ambulatory Visit | Attending: Radiation Oncology | Admitting: Radiation Oncology

## 2017-03-19 DIAGNOSIS — Z510 Encounter for antineoplastic radiation therapy: Secondary | ICD-10-CM | POA: Diagnosis not present

## 2017-03-20 ENCOUNTER — Encounter: Payer: Self-pay | Admitting: Medical Oncology

## 2017-03-20 ENCOUNTER — Ambulatory Visit
Admission: RE | Admit: 2017-03-20 | Discharge: 2017-03-20 | Disposition: A | Discharge status: Home / Self Care | Payer: Medicare Other | Source: Ambulatory Visit | Attending: Radiation Oncology | Admitting: Radiation Oncology

## 2017-03-20 DIAGNOSIS — Z510 Encounter for antineoplastic radiation therapy: Secondary | ICD-10-CM | POA: Diagnosis not present

## 2017-03-20 NOTE — Progress Notes (Signed)
Celebrated with mr. [name] and his family as he completed radiation treatments today. He states he is doing well and happy to be finished. He has follow up appointment with Ashlyn 04/24/17.

## 2017-03-21 ENCOUNTER — Encounter: Payer: Self-pay | Admitting: Radiation Oncology

## 2017-03-21 NOTE — Progress Notes (Signed)
  Radiation Oncology         [phone]) [phone] ________________________________  Name: Alan Rivera MRN: [id number]  Date: 03/21/2017  DOB: September 14, 1946  End of Treatment Note  Diagnosis:  71 year old gentleman with stage T2a adenocarcinoma of the prostate with a Gleason's score of 4+5 and a PSA of 6.68    Indication for treatment:  Curative, Definitive Radiotherapy       Radiation treatment dates:   01/23/17 - 03/20/17  Site/dose:  1. The prostate, seminal vesicles, and pelvic lymph nodes were initially treated to 45 Gy in 25 fractions of 1.8 Gy  2. The prostate only was boosted to 75 Gy with 15 additional fractions of 2.0 Gy   Beams/energy:  1. The prostate, seminal vesicles, and pelvic lymph nodes were initially treated using helical intensity modulated radiotherapy delivering 6 megavolt photons. Image guidance was performed with megavoltage CT studies prior to each fraction. He was immobilized with a body fix lower extremity mold.  2. the prostate only was boosted using helical intensity modulated radiotherapy delivering 6 megavolt photons. Image guidance was performed with megavoltage CT studies prior to each fraction. He was immobilized with a body fix lower extremity mold.  Narrative: The patient tolerated radiation treatment relatively well. The patient reported intense dysuria towards the end of treatment, for which he requested intervention. He denied hematuria throughout treatment. The patient also reported soft stools with diarrhea x 3 daily. He reported fatigue throughout treatment, and would frequently nap.  Plan: The patient has completed radiation treatment. He was prescribed Flomax for urinary symptoms, and may take Pyridium OTC for additional relief as needed. He will return to radiation oncology clinic for routine followup in one month. I advised him to call or return sooner if he has any questions or concerns related to his recovery or  treatment. ________________________________  Sheral Apley. Tammi Klippel, M.D.  This document serves as a record of services personally performed by Tyler Pita, MD. It was created on his behalf by Maryla Morrow, a trained medical scribe. The creation of this record is based on the scribe's personal observations and the provider's statements to them. This document has been checked and approved by the attending provider.

## 2017-04-24 ENCOUNTER — Ambulatory Visit
Admission: RE | Admit: 2017-04-24 | Discharge: 2017-04-24 | Disposition: A | Discharge status: Home / Self Care | Payer: Medicare Other | Source: Ambulatory Visit | Attending: Radiation Oncology | Admitting: Radiation Oncology

## 2017-04-24 ENCOUNTER — Encounter: Payer: Self-pay | Admitting: Urology

## 2017-04-24 VITALS — BP 117/76 | HR 86 | Temp 97.7°F | Resp 18 | Ht 67.0 in | Wt <= 1120 oz

## 2017-04-24 DIAGNOSIS — C61 Malignant neoplasm of prostate: Secondary | ICD-10-CM | POA: Insufficient documentation

## 2017-04-24 DIAGNOSIS — R300 Dysuria: Secondary | ICD-10-CM | POA: Insufficient documentation

## 2017-04-24 DIAGNOSIS — Z510 Encounter for antineoplastic radiation therapy: Secondary | ICD-10-CM | POA: Diagnosis not present

## 2017-04-24 NOTE — Addendum Note (Signed)
Encounter addended by: Malena Edman, RN on: 04/24/2017  4:37 PM<BR>    Actions taken: Charge Capture section accepted

## 2017-04-24 NOTE — Progress Notes (Signed)
Radiation Oncology         [phone]) [phone] ________________________________  Name: Trae Bovenzi MRN: [id number]  Date: 04/24/2017  DOB: September 11, 1946  Post Treatment Note  CC: [name], MD  [name], MD  Diagnosis:    71 year old gentleman with stage T2a adenocarcinoma of the prostate with a Gleason's score of 4+5 and a PSA of 6.68    Interval Since Last Radiation:  5 weeks  01/23/17 - 03/20/17: 1. The prostate, seminal vesicles, and pelvic lymph nodes were initially treated to 45 Gy in 25 fractions of 1.8 Gy  2. The prostate only was boosted to 75 Gy with 15 additional fractions of 2.0 Gy   Narrative:  The patient returns today for routine follow-up.  He tolerated radiotherapy relatively well with the exception of significant dysuria towards the end of treatment which was managed with OTC AZO standard and Flomax. He also noted modest fatigue and diarrhea.  He did have SpaceOAR implant prior to initiating radiotherapy.  He started ADT on 09/22/16 with plans to complete a full 2 years of therapy. Overall he is tolerating this well despite fatigue and decreased libido.                       On review of systems, the patient states that he has noticed a gradual improvement in his frequency, urgency and nocturia.  The dysuria has completely resolved at this point and he denies gross hematuria, hesitancy, weak stream or intermittency. He feels like he empties his bladder well on voiding.  He reports a healthy appetite and is maintaining his weight.  He denies abdominal pain, N/V/D or constipation.  He has continued on Flomax which he tolerates well.  ALLERGIES:  is allergic to shellfish allergy.  Meds: Current Outpatient Prescriptions  Medication Sig Dispense Refill  . alendronate (FOSAMAX) 70 MG tablet Take 70 mg by mouth once a week. Take with a full glass of water on an empty stomach.    Marland Kitchen aspirin EC 81 MG tablet Take 81 mg by mouth daily. Taking two tablets per day.    .  cholecalciferol (VITAMIN D) 1000 units tablet Take 1,000 Units by mouth every other day.     . lisinopril (PRINIVIL,ZESTRIL) 10 MG tablet Take 10 mg by mouth daily.    Marland Kitchen omega-3 acid ethyl esters (LOVAZA) 1 g capsule Take 1 g by mouth daily.     . tamsulosin (FLOMAX) 0.4 MG CAPS capsule Take 1 capsule (0.4 mg total) by mouth daily after supper. 30 capsule 5  . vitamin B-12 (CYANOCOBALAMIN) 1000 MCG tablet Take 1,000 mcg by mouth daily.    . Vitamin D, Ergocalciferol, (DRISDOL) 50000 units CAPS capsule Take 50,000 Units by mouth every 7 (seven) days.     No current facility-administered medications for this encounter.     Physical Findings:  height is 5\' 7"  (1.702 m) and weight is 17 lb 4.2 oz (7.829 kg). His oral temperature is 97.7 F (36.5 C). His blood pressure is 117/76 and his pulse is 86. His respiration is 18 and oxygen saturation is 97%.  Pain Assessment Pain Score: 0-No pain/10 In general this is a well appearing caucasian male in no acute distress. He's alert and oriented x4 and appropriate throughout the examination. Cardiopulmonary assessment is negative for acute distress and he exhibits normal effort.   Lab Findings: Lab Results  Component Value Date   WBC 6.8 01/02/2017   HGB 15.1 01/02/2017  HCT 43.8 01/02/2017   MCV 92.2 01/02/2017   PLT 182 01/02/2017     Radiographic Findings: No results found.  Impression/Plan: 64. 71 year old gentleman with stage T2a adenocarcinoma of the prostate with a Gleason's score of 4+5 and a PSA of 6.68.  He will continue to follow up with urology for ongoing PSA determinations and has an appointment scheduled with Dr. Alyson Ingles in August 2018.  He was seen at [facility] last week and told that his PSA was decreased to 0.05.  He continues on ADT, under the care and direction of Dr. Alyson Ingles, and anticipates completing a total of 2 years of therapy. He understands what to expect with regards to PSA monitoring going forward. I will look  forward to following his response to treatment via correspondence with urology, and would be happy to continue to participate in his care if clinically indicated. I talked to the patient about what to expect in the future, including his risk for erectile dysfunction rectal bleeding. I encouraged him to call or return to the office if he has any questions regarding his previous radiation or possible radiation side effects. He was comfortable with this plan and will follow up as needed.    Nicholos Johns, PA-C

## 2017-06-05 NOTE — Addendum Note (Signed)
Encounter addended by: Heywood Footman, RN on: 06/05/2017  8:32 AM<BR>    Actions taken: Visit Navigator Flowsheet section accepted

## 2017-07-05 ENCOUNTER — Telehealth: Payer: Self-pay | Admitting: Medical Oncology

## 2017-07-05 NOTE — Telephone Encounter (Signed)
I spoke with Alan Rivera and asked if he would be willing to do an interview about his experience with SpaceOar. He states he would be delighted. He states he is doing well post radiation.

## 2018-03-11 IMAGING — DX DG CHEST 2V
2 series · 2 of 2 positions shown · non-contrast
Comparison: None.

CLINICAL DATA: Preop examination, prostate cancer

EXAM:
CHEST  2 VIEW

[chest pa]
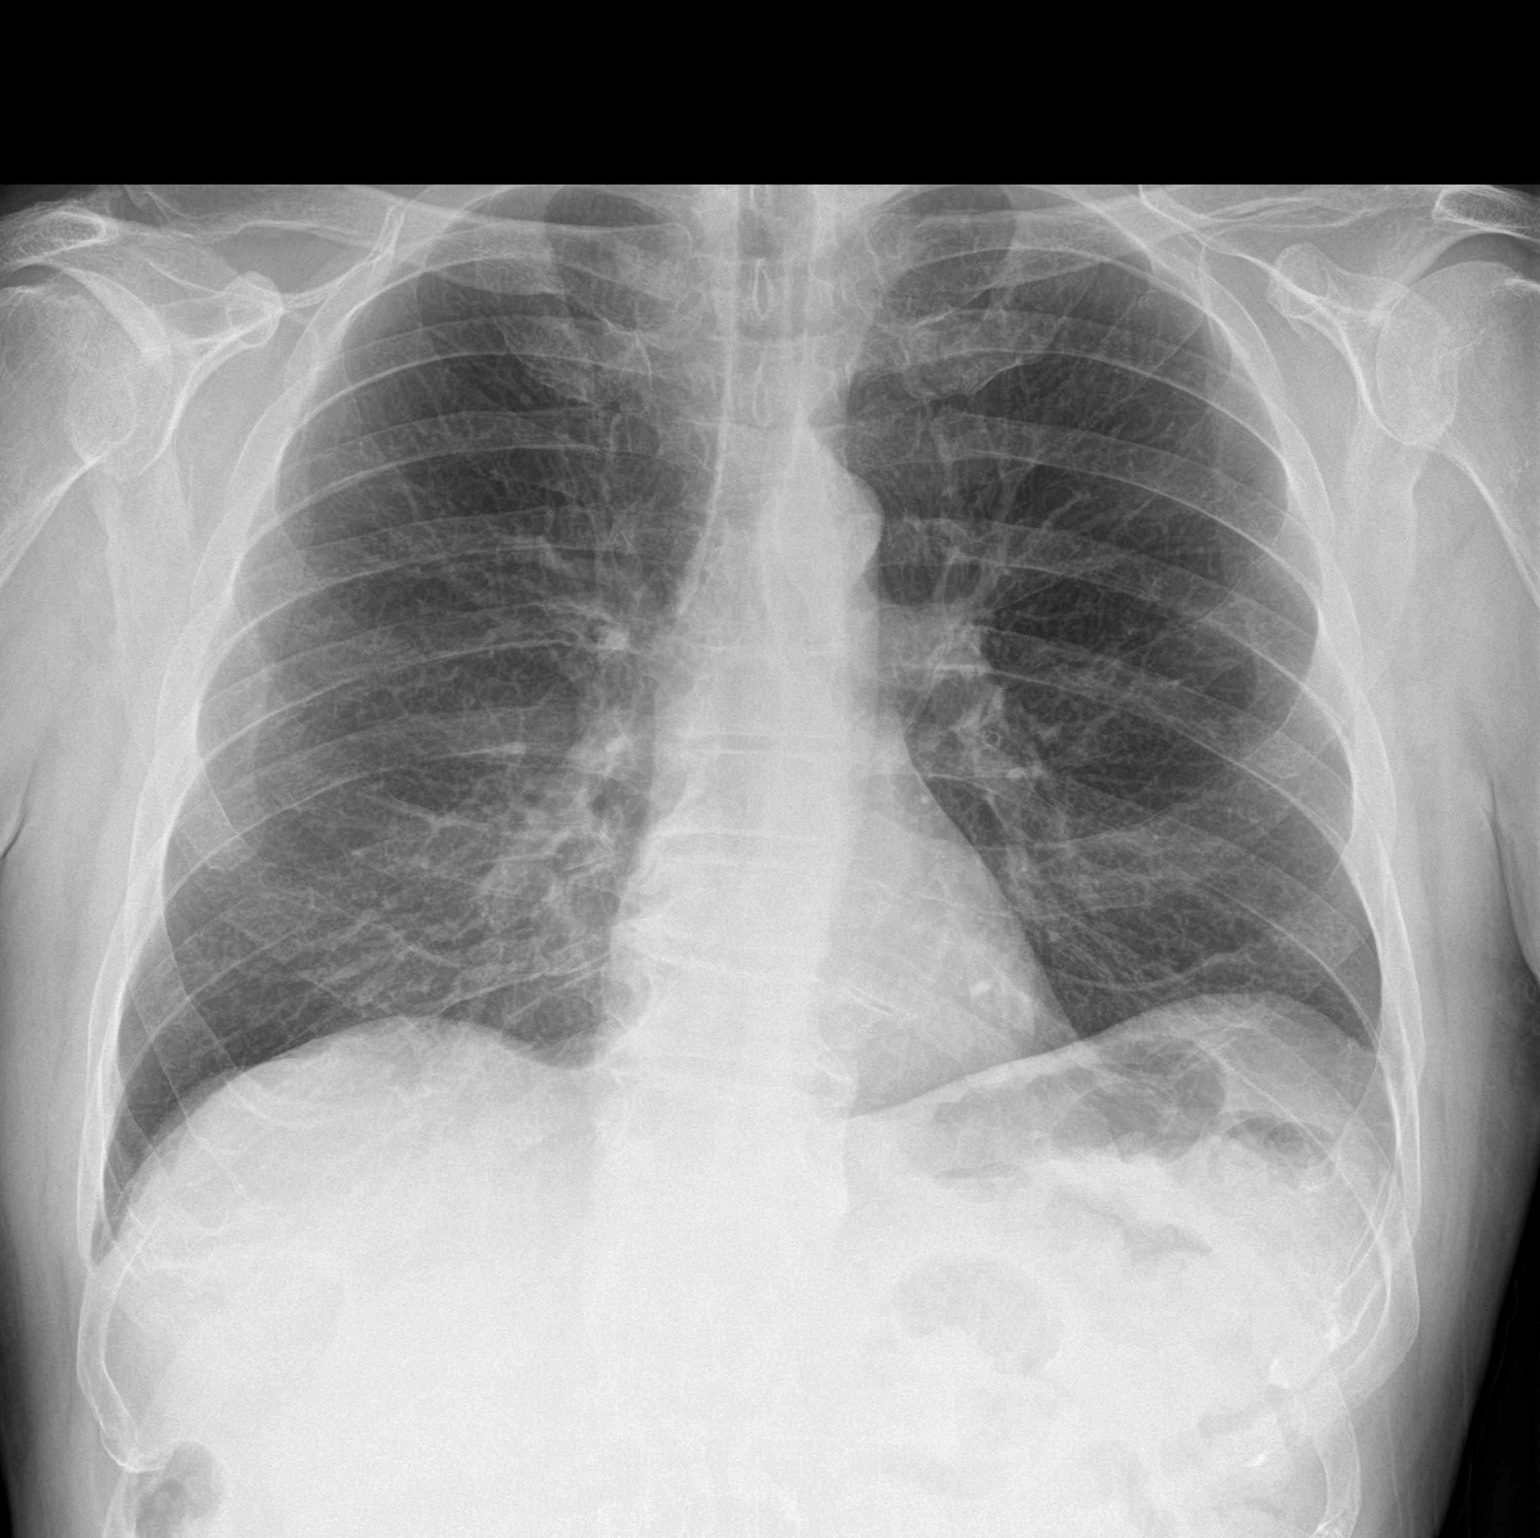

[chest lat]
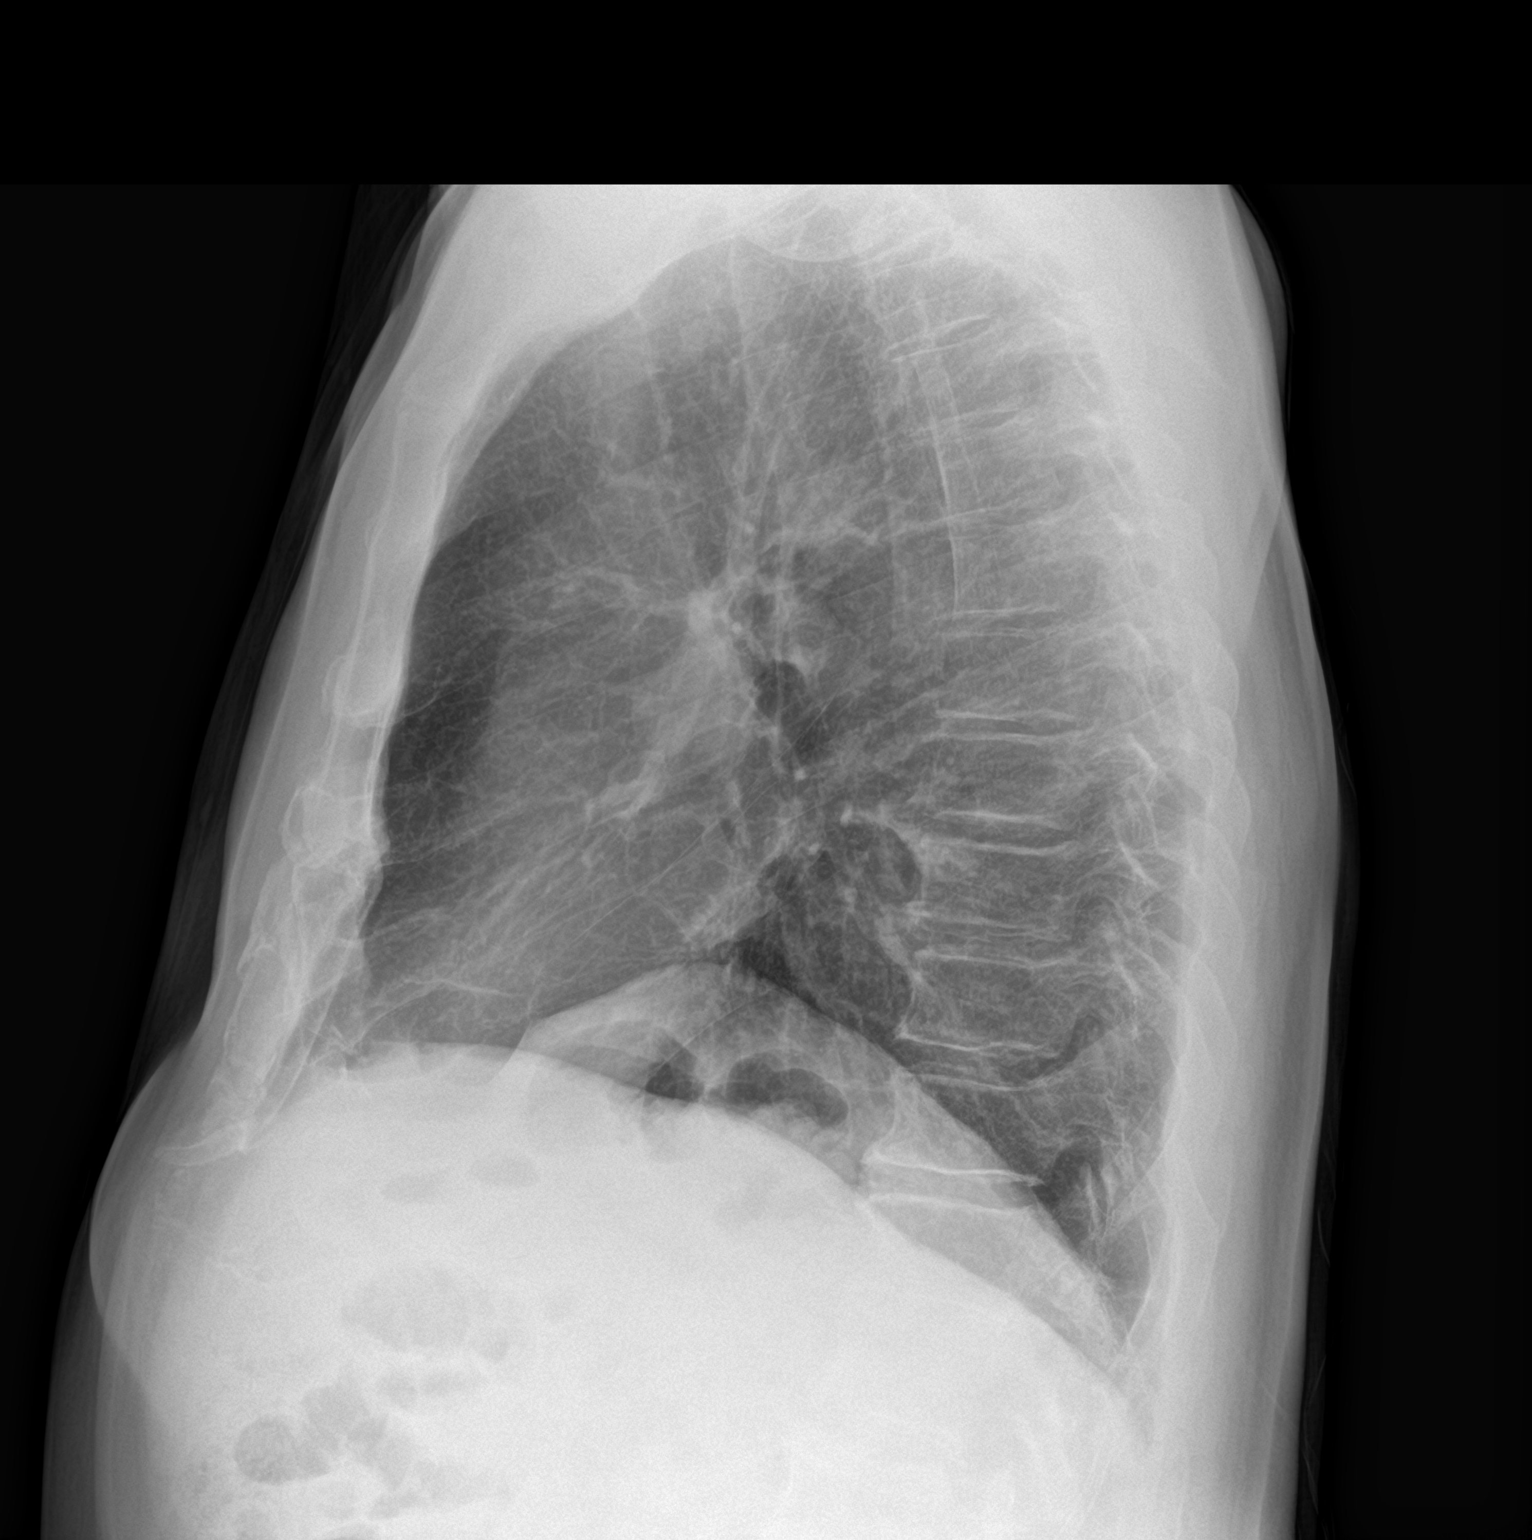

[2 of 2 positions shown; findings below may reference images not displayed]

FINDINGS: Cardiomediastinal silhouette is unremarkable. No infiltrate or
pleural effusion. No pulmonary edema. Mild degenerative changes mid
and lower thoracic spine. Mild degenerative changes bilateral
shoulders.
IMPRESSION: No active cardiopulmonary disease. Mild degenerative changes mid and
lower thoracic spine

## 2019-06-12 ENCOUNTER — Other Ambulatory Visit: Payer: Self-pay | Admitting: Family Medicine

## 2019-06-12 DIAGNOSIS — M810 Age-related osteoporosis without current pathological fracture: Secondary | ICD-10-CM

## 2019-06-16 ENCOUNTER — Encounter: Payer: Self-pay | Admitting: Podiatry

## 2019-06-16 ENCOUNTER — Other Ambulatory Visit: Payer: Self-pay

## 2019-06-16 ENCOUNTER — Ambulatory Visit (INDEPENDENT_AMBULATORY_CARE_PROVIDER_SITE_OTHER): Payer: Medicare Other | Admitting: Podiatry

## 2019-06-16 VITALS — BP 111/66 | Temp 97.7°F

## 2019-06-16 DIAGNOSIS — B353 Tinea pedis: Secondary | ICD-10-CM | POA: Diagnosis not present

## 2019-06-16 DIAGNOSIS — Q72891 Other reduction defects of right lower limb: Secondary | ICD-10-CM

## 2019-06-16 DIAGNOSIS — M79675 Pain in left toe(s): Secondary | ICD-10-CM

## 2019-06-16 DIAGNOSIS — B351 Tinea unguium: Secondary | ICD-10-CM

## 2019-06-16 DIAGNOSIS — Z8612 Personal history of poliomyelitis: Secondary | ICD-10-CM | POA: Diagnosis not present

## 2019-06-16 DIAGNOSIS — M79674 Pain in right toe(s): Secondary | ICD-10-CM

## 2019-06-16 MED ORDER — CICLOPIROX OLAMINE 0.77 % EX CREA: TOPICAL_CREAM | Freq: Two times a day (BID) | CUTANEOUS | 1 refills | Status: AC

## 2019-06-16 NOTE — Patient Instructions (Signed)
Athlete's Foot  Athlete's foot (tinea pedis) is a fungal infection of the skin on your feet. It often occurs on the skin that is between or underneath the toes. It can also occur on the soles of your feet. The infection can spread from person to person (is contagious). It can also spread when a person's bare feet come in contact with the fungus on shower floors or on items such as shoes. What are the causes? This condition is caused by a fungus that grows in warm, moist places. You can get athlete's foot by sharing shoes, shower stalls, towels, and wet floors with someone who is infected. Not washing your feet or changing your socks often enough can also lead to athlete's foot. What increases the risk? This condition is more likely to develop in:  Men.  People who have a weak body defense system (immune system).  People who have diabetes.  People who use public showers, such as at a gym.  People who wear heavy-duty shoes, such as industrial or military shoes.  Seasons with warm, humid weather. What are the signs or symptoms? Symptoms of this condition include:  Itchy areas between your toes or on the soles of your feet.  White, flaky, or scaly areas between your toes or on the soles of your feet.  Very itchy small blisters between your toes or on the soles of your feet.  Small cuts in your skin. These cuts can become infected.  Thick or discolored toenails. How is this diagnosed? This condition may be diagnosed with a physical exam and a review of your medical history. Your health care provider may also take a skin or toenail sample to examine under a microscope. How is this treated? This condition is treated with antifungal medicines. These may be applied as powders, ointments, or creams. In severe cases, an oral antifungal medicine may be given. Follow these instructions at home: Medicines  Apply or take over-the-counter and prescription medicines only as told by your health  care provider.  Apply your antifungal medicine as told by your health care provider. Do not stop using the antifungal even if your condition improves. Foot care  Do not scratch your feet.  Keep your feet dry: ? Wear cotton or wool socks. Change your socks every day or if they become wet. ? Wear shoes that allow air to flow, such as sandals or canvas tennis shoes.  Wash and dry your feet, including the area between your toes. Also, wash and dry your feet: ? Every day or as told by your health care provider. ? After exercising. General instructions  Do not let others use towels, shoes, nail clippers, or other personal items that touch your feet.  Protect your feet by wearing sandals in wet areas, such as locker rooms and shared showers.  Keep all follow-up visits as told by your health care provider. This is important.  If you have diabetes, keep your blood sugar under control. Contact a health care provider if:  You have a fever.  You have swelling, soreness, warmth, or redness in your foot.  Your feet are not getting better with treatment.  Your symptoms get worse.  You have new symptoms. Summary  Athlete's foot (tinea pedis) is a fungal infection of the skin on your feet. It often occurs on skin that is between or underneath the toes.  This condition is caused by a fungus that grows in warm, moist places.  Symptoms include white, flaky, or scaly areas between   your toes or on the soles of your feet.  This condition is treated with antifungal medicines.  Keep your feet clean. Always dry them thoroughly. This information is not intended to replace advice given to you by your health care provider. Make sure you discuss any questions you have with your health care provider. Document Released: 10/20/2000 Document Revised: 10/18/2017 Document Reviewed: 08/13/2017 Elsevier Patient Education  2020 [employer]   Onychomycosis/Fungal Toenails  WHAT IS IT? An infection  that lies within the keratin of your nail plate that is caused by a fungus.  WHY ME? Fungal infections affect all ages, sexes, races, and creeds.  There may be many factors that predispose you to a fungal infection such as age, coexisting medical conditions such as diabetes, or an autoimmune disease; stress, medications, fatigue, genetics, etc.  Bottom line: fungus thrives in a warm, moist environment and your shoes offer such a location.  IS IT CONTAGIOUS? Theoretically, yes.  You do not want to share shoes, nail clippers or files with someone who has fungal toenails.  Walking around barefoot in the same room or sleeping in the same bed is unlikely to transfer the organism.  It is important to realize, however, that fungus can spread easily from one nail to the next on the same foot.  HOW DO WE TREAT THIS?  There are several ways to treat this condition.  Treatment may depend on many factors such as age, medications, pregnancy, liver and kidney conditions, etc.  It is best to ask your doctor which options are available to you.  1. No treatment.   Unlike many other medical concerns, you can live with this condition.  However for many people this can be a painful condition and may lead to ingrown toenails or a bacterial infection.  It is recommended that you keep the nails cut short to help reduce the amount of fungal nail. 2. Topical treatment.  These range from herbal remedies to prescription strength nail lacquers.  About 40-50% effective, topicals require twice daily application for approximately 9 to 12 months or until an entirely new nail has grown out.  The most effective topicals are medical grade medications available through physicians offices. 3. Oral antifungal medications.  With an 80-90% cure rate, the most common oral medication requires 3 to 4 months of therapy and stays in your system for a year as the new nail grows out.  Oral antifungal medications do require blood work to make sure it is  a safe drug for you.  A liver function panel will be performed prior to starting the medication and after the first month of treatment.  It is important to have the blood work performed to avoid any harmful side effects.  In general, this medication safe but blood work is required. 4. Laser Therapy.  This treatment is performed by applying a specialized laser to the affected nail plate.  This therapy is noninvasive, fast, and non-painful.  It is not covered by insurance and is therefore, out of pocket.  The results have been very good with a 80-95% cure rate.  [facility] is the only practice in the area to offer this therapy. 5. Permanent Nail Avulsion.  Removing the entire nail so that a new nail will not grow back. 

## 2019-06-24 NOTE — Progress Notes (Signed)
Subjective: Alan Rivera presents today referred by [name], MD with cc of painful, discolored, thick toenails which interfere with daily activities. Duration greater than one month.  Pain is aggravated when wearing enclosed shoe gear. He has tried nothing to treat his condition.  Past Medical History:  Diagnosis Date  . Bilateral renal cysts   . ED (erectile dysfunction)   . Frequency-urgency syndrome   . HOH (hard of hearing)    left side  . Hypertension   . 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 Prostate cancer ([name]) UROLOGIST-  [name]/  ONCOLOGIST -- [name]   dx 10/ 2017-- Stage T2a,  Gleason 4+5,  PSA 6.68, vol 43cc  . Uses walker    prn  . Wears glasses      Patient Active Problem List   Diagnosis Date Noted  . Malignant neoplasm of prostate ([name]) 09/18/2016  . Polio 09/18/2016  . HTN (hypertension) 09/18/2016     Past Surgical History:  Procedure Laterality Date  . FOOT SURGERY Bilateral child   l related to effects of polio  . PROSTATE BIOPSY    . PROSTATE BIOPSY N/A 01/05/2017   Procedure: PROSTATE ULTRASOUND WITH SPACE OAR;  Surgeon: [name], MD;  Location: [facility];  Service: Urology;  Laterality: N/A;  . SHOULDER ARTHROSCOPY WITH OPEN ROTATOR CUFF REPAIR Bilateral right 04/30/2007;  left 10-15-2007   w/ Debridement/  DCR/  SAD/  Acromioplasty/  Bursectomy  . TONSILLECTOMY AND ADENOIDECTOMY      Medications: ACE Inhibitors lisinopril (PRINIVIL,ZESTRIL) 10 MG tablet 10 mg,  Antihyperlipidemics - Misc. omega-3 acid ethyl esters (LOVAZA) 1 g capsule 1 g,  Daily Bone Density Regulators alendronate (FOSAMAX) 70 MG tablet 70 mg,  Weekly Cobalamins vitamin B-12 (CYANOCOBALAMIN) 1000 MCG tablet 1,000 mcg,  Daily Oil Soluble Vitamins cholecalciferol  (VITAMIN D) 1000 units tablet 1,000 Units,  Every other day Vitamin D, Ergocalciferol, (DRISDOL) 50000 units CAPS capsule 50,000 Units, Every 7 days Prostatic Hypertrophy Agents tamsulosin (FLOMAX) 0.4 MG CAPS capsule 0.4 mg, Daily after supper  Salicylates aspirin EC 81 MG tablet 81 mg, Daily   Allergies  Allergen Reactions  . Shellfish Allergy Anaphylaxis     Social History   Occupational History  . Not on file  Tobacco Use  . Smoking status: Former Smoker    Packs/day: 3.00    Years: 12.00    Pack years: 36.00    Types: Cigarettes    Quit date: 06/06/1981    Years since quitting: 38.0  . Smokeless tobacco: Never Used  Substance and Sexual Activity  . Alcohol use: Yes    Alcohol/week: 4.0 standard drinks    Types: 4 Glasses of wine per week  . Drug use: No  . Sexual activity: Yes    Comment: vasectomy     Family History  Problem Relation Age of Onset  . Cancer Father        lung and prostate cancer     There is no immunization history on file for this patient.   Review of systems: Positive Findings in bold print.  Constitutional:  chills, fatigue, fever, sweats, weight change Communication: Optometrist, sign Ecologist, hand writing, iPad/Android device Head: headaches, head injury Eyes: changes in vision, eye pain, glaucoma, cataracts, macular degeneration, diplopia, glare,  light sensitivity, eyeglasses  or contacts, blindness Ears nose mouth throat: hearing impaired, hearing aids,  ringing in ears, deaf, sign language,  vertigo,   nosebleeds,  rhinitis,  cold sores, snoring, swollen glands Cardiovascular: HTN, edema, arrhythmia, pacemaker in place, defibrillator in place, chest pain/tightness, chronic anticoagulation, blood clot, heart failure, MI Peripheral Vascular: leg cramps, varicose veins, blood clots, lymphedema, varicosities Respiratory:  difficulty breathing, denies congestion, SOB, wheezing, cough, emphysema Gastrointestinal: change in appetite or  weight, abdominal pain, constipation, diarrhea, nausea, vomiting, vomiting blood, change in bowel habits, abdominal pain, jaundice, rectal bleeding, hemorrhoids, GERD Genitourinary:  nocturia,  pain on urination, polyuria,  blood in urine, Foley catheter, urinary urgency, ESRD on hemodialysis Musculoskeletal: amputation, cramping, stiff joints, painful joints, decreased joint motion, fractures, OA, gout, hemiplegia, paraplegia, uses cane, wheelchair bound, uses walker, uses rollator Skin: +changes in toenails, color change, dryness, itching, mole changes,  rash, wound(s) Neurological: headaches, numbness in feet, paresthesias in feet, burning in feet, fainting,  seizures, change in speech. denies headaches, memory problems/poor historian, cerebral palsy, weakness, paralysis, CVA, TIA Endocrine: diabetes, hypothyroidism, hyperthyroidism,  goiter, dry mouth, flushing, heat intolerance,  cold intolerance,  excessive thirst, denies polyuria,  nocturia Hematological:  easy bleeding, excessive bleeding, easy bruising, enlarged lymph nodes, on long term blood thinner, history of past transusions Allergy/immunological:  hives, eczema, frequent infections, multiple drug allergies, seasonal allergies, transplant recipient, multiple food allergies Psychiatric:  anxiety, depression, mood disorder, suicidal ideations, hallucinations, insomnia  Objective: Vitals:   06/16/19 1514  BP: 111/66  Temp: 97.7 F (36.5 C)    Vascular Examination: Capillary refill time immediate x 10 digits.  Dorsalis pedis pulses palpable b/l.   Posterior tibial pulses palpable b/l.   No digital hair x 10 digits.  Skin temperature gradient WNL b/l.  Dermatological Examination: Skin with normal turgor, texture and tone b/l.  Toenails 1-5 b/l discolored, thick, dystrophic with subungual debris and pain with palpation to nailbeds due to thickness of nails.  Diffuse scaling noted peripherally and plantarly b/l feet with mild  foot odor.  No interdigital macerations.  No blisters, no weeping. No signs of secondary bacterial infection noted.  Musculoskeletal: Muscle strength 3/5 b/l to all LE muscle groups secondary to Polio.  LE muscle atrophy b/l LE secondary to Polio.  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  Neurological: Sensation intact with 10 gram monofilament.  Vibratory sensation intact.  Assessment: 1. Painful onychomycosis toenails 1-5 b/l  2. Tinea pedis b/l 3. 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 H/o Polio  Plan: 1. Discussed onychomycosis and treatment options.  Literature dispensed on today. 2. Toenails 1-5 b/l were debrided in length and girth without iatrogenic bleeding. For tinea pedis, prescription written for ciclopirox cream 0.77%.  Patient is to apply to both feet and between toes twice daily for 4 weeks. 3. Patient to continue soft, supportive shoe gear daily. 4. Patient to report any pedal injuries to medical professional immediately. 5. Follow up 3 months.  6. Patient/POA to call should there be a concern in the interim.

## 2019-08-07 NOTE — Progress Notes (Signed)
Cardiology Office Note   Date:  08/08/2019   ID:  Alan Rivera, DOB [birth year], MRN [id number]  PCP:  [name], MD    No chief complaint on file.  palpitations  Wt Readings from Last 3 Encounters:  08/08/19 175 lb 1.9 oz (79.4 kg)  04/24/17 17 lb 4.2 oz (7.829 kg)  02/09/17 168 lb 6.4 oz (76.4 kg)       History of Present Illness: Alan Rivera is a 73 y.o. male with a h/o HTN.    No prior cardiac issues in the past.  Had a stress test many years ago that was normal   He was seen in Jan 2020 in [place] and had bronchitis.  He was found to have an irregular heartbeat.  He went to the ER.  He was told to find a cardiologist.  He does not have any records from there.  He does not remember any mention of AFib.  BP has been controlled.  0-5/14 readings in a  week will show an irregular heartbeat.  BP readings are in the 0000000 systolic.   He is not feeling any sx with the irregular heart beat.  Denies : Chest pain. Dizziness. Leg edema. Nitroglycerin use. Orthopnea. Palpitations. Paroxysmal nocturnal dyspnea. Shortness of breath. Syncope.     Past Medical History:  Diagnosis Date  . Bilateral renal cysts   . Diverticulosis   . ED (erectile dysfunction)   . Family history of malignant neoplasm of prostate   . Frequency-urgency syndrome   . HOH (hard of hearing)    left side  . HTN (hypertension) 09/18/2016  . Hypertension   . Impaired fasting glucose   . Irregular heartbeat   . Malignant neoplasm of prostate ([place]) 09/18/2016  . Osteoporosis   . Polio    age 21-- effected bilateral feet--  currently bilateral diffuse weakness upper and lower extremities  . 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 Prostate cancer ([place]) UROLOGIST-  [name]/  ONCOLOGIST -- [name]   dx 10/ 2017-- Stage T2a,  Gleason 4+5,  PSA 6.68, vol 43cc  . Umbilical hernia   . Uses walker    prn  . Wears glasses      Past Surgical History:  Procedure Laterality Date  . FOOT SURGERY Bilateral child   l related to effects of polio  . PROSTATE BIOPSY    . PROSTATE BIOPSY N/A 01/05/2017   Procedure: PROSTATE ULTRASOUND WITH SPACE OAR;  Surgeon: [name], MD;  Location: [facility];  Service: Urology;  Laterality: N/A;  . SHOULDER ARTHROSCOPY WITH OPEN ROTATOR CUFF REPAIR Bilateral right 04/30/2007;  left 10-15-2007   w/ Debridement/  DCR/  SAD/  Acromioplasty/  Bursectomy  . TONSILLECTOMY AND ADENOIDECTOMY       Current Outpatient Medications  Medication Sig Dispense Refill  . alendronate (FOSAMAX) 70 MG tablet Take 70 mg by mouth once a week. Take with a full glass of water on an empty stomach.    . EPINEPHrine (EPIPEN JR 2-PAK) 0.15 MG/0.3ML injection Inject 0.15 mg into the muscle as needed for anaphylaxis.    Marland Kitchen lisinopril (PRINIVIL,ZESTRIL) 10 MG tablet Take 10 mg by mouth daily.    Marland Kitchen omega-3 acid ethyl esters (LOVAZA) 1 g capsule Take 1 g by mouth daily.     . tamsulosin (FLOMAX) 0.4 MG CAPS capsule Take 1 capsule (0.4 mg total) by mouth daily after supper. [place]  capsule 5   No current facility-administered medications for this visit.     Allergies:   Shellfish allergy    Social History:  The patient  reports that he quit smoking about 38 years ago. His smoking use included cigarettes. He has a 36.00 pack-year smoking history. He has never used smokeless tobacco. He reports current alcohol use of about 4.0 standard drinks of alcohol per week. He reports that he does not use drugs.   Family History:  The patient's *family history includes Cancer in his father; Heart disease in his mother; Heart failure in his mother.    ROS:  Please see the history of present illness.   Otherwise, review of systems are positive for prior ankle surgery.   All other systems are reviewed and negative.    PHYSICAL EXAM: VS:  BP 112/60   Pulse 63   Ht 5\' 7"  (1.702 m)   Wt 175 lb 1.9 oz  (79.4 kg)   SpO2 97%   BMI 27.43 kg/m  , BMI Body mass index is 27.43 kg/m. GEN: Well nourished, well developed, in no acute distress  HEENT: normal  Neck: no JVD, carotid bruits, or masses Cardiac: RRR; no murmurs, rubs, or gallops  Respiratory:  clear to auscultation bilaterally, normal work of breathing GI: soft, nontender, nondistended, + BS MS: no deformity or atrophy ; right ankle swelling Skin: warm and dry, no rash Neuro:  Strength and sensation are intact Psych: euthymic mood, full affect   EKG:   The ekg ordered today demonstrates NSR, clear P waves; no arrhythmia   Recent Labs: No results found for requested labs within last 8760 hours.   Lipid Panel No results found for: CHOL, TRIG, HDL, CHOLHDL, VLDL, LDLCALC, LDLDIRECT   Other studies Reviewed: Additional studies/ records that were reviewed today with results demonstrating: labs reviewed.   ASSESSMENT AND PLAN:  1. Irregular heartbeat: Will plan for 30 day event monitor.  He will also check the pulse at his wrist. 2. Will try to get records from [facility].  [facility], [place], [place]) 3. HTN: Lisinopril.  The current medical regimen is effective;  continue present plan and medications.   Current medicines are reviewed at length with the patient today.  The patient concerns regarding his medicines were addressed.  The following changes have been made:  No change  Labs/ tests ordered today include:  No orders of the defined types were placed in this encounter.   Recommend 150 minutes/week of aerobic exercise Low fat, low carb, high fiber diet recommended  Disposition:   FU based on result of monitor   Signed, Larae Grooms, MD  08/08/2019 11:25 AM    [facility] [place], [place], [place]  [address] Phone: [phone]; Fax: [phone]

## 2019-08-08 ENCOUNTER — Encounter: Payer: Self-pay | Admitting: Interventional Cardiology

## 2019-08-08 ENCOUNTER — Ambulatory Visit (INDEPENDENT_AMBULATORY_CARE_PROVIDER_SITE_OTHER): Payer: Medicare Other | Admitting: Interventional Cardiology

## 2019-08-08 ENCOUNTER — Other Ambulatory Visit: Payer: Self-pay

## 2019-08-08 VITALS — BP 112/60 | HR 63 | Ht 67.0 in | Wt 175.1 lb

## 2019-08-08 DIAGNOSIS — I10 Essential (primary) hypertension: Secondary | ICD-10-CM

## 2019-08-08 DIAGNOSIS — R002 Palpitations: Secondary | ICD-10-CM

## 2019-08-08 NOTE — Patient Instructions (Signed)
Medication Instructions:  Your physician recommends that you continue on your current medications as directed. Please refer to the Current Medication list given to you today.  If you need a refill on your cardiac medications before your next appointment, please call your pharmacy.   Lab work: None Ordered  If you have labs (blood work) drawn today and your tests are completely normal, you will receive your results only by:  [place] (if you have MyChart) OR  A paper copy in the mail If you have any lab test that is abnormal or we need to change your treatment, we will call you to review the results.  Testing/Procedures: Your physician has recommended that you wear an event monitor. Event monitors are medical devices that record the hearts electrical activity. Doctors most often [place] these monitors to diagnose arrhythmias. Arrhythmias are problems with the speed or rhythm of the heartbeat. The monitor is a small, portable device. You can wear one while you do your normal daily activities. This is usually used to diagnose what is causing palpitations/syncope (passing out).  Follow-Up:  Based on test results  Any Other Special Instructions Will Be Listed Below (If Applicable).   Ambulatory Cardiac Monitoring An ambulatory cardiac monitor is a small recording device that is used to detect abnormal heart rhythms (arrhythmias). Most monitors are connected by wires to flat, sticky disks (electrodes) that are then attached to your chest. You may need to wear a monitor if you have had symptoms such as:  Fast heartbeats (palpitations).  Dizziness.  Fainting or light-headedness.  Unexplained weakness.  Shortness of breath. There are several types of monitors. Some common monitors include:  Holter monitor. This records your heart rhythm continuously, usually for 24-48 hours.  Event (episodic) monitor. This monitor has a symptoms button, and when pushed, it will begin recording. You  need to activate this monitor to record when you have a heart-related symptom.  Automatic detection monitor. This monitor will begin recording when it detects an abnormal heartbeat. What are the risks? Generally, these devices are safe to use. However, it is possible that the skin under the electrodes will become irritated. How to prepare for monitoring Your health care provider will prepare your chest for the electrode placement and show you how to use the monitor.  Do not apply lotions to your chest before monitoring.  Follow directions on how to care for the monitor, and how to return the monitor when the testing period is complete. How to use your cardiac monitor  Follow directions about how long to wear the monitor, and if you can take the monitor off in order to shower or bathe. ? Do not let the monitor get wet. ? Do not bathe, swim, or use a hot tub while wearing the monitor.  Keep your skin clean. Do not put body lotion or moisturizer on your chest.  Change the electrodes as told by your health care provider, or any time they stop sticking to your skin. You may need to use medical tape to keep them on.  Try to put the electrodes in slightly different places on your chest to help prevent skin irritation. Follow directions from your health care provider about where to place the electrodes.  Make sure the monitor is safely clipped to your clothing or in a location close to your body as recommended by your health care provider.  If your monitor has a symptoms button, press the button to mark an event as soon as you feel  a heart-related symptom, such as: ? Dizziness. ? Weakness. ? Light-headedness. ? Palpitations. ? Thumping or pounding in your chest. ? Shortness of breath. ? Unexplained weakness.  Keep a diary of your activities, such as walking, doing chores, and taking medicine. It is very important to note what you were doing when you pushed the button to record your  symptoms. This will help your health care provider determine what might be contributing to your symptoms.  Send the recorded information as recommended by your health care provider. It may take some time for your health care provider to process the results.  Change the batteries as told by your health care provider.  Keep electronic devices away from your monitor. These include: ? Tablets. ? MP3 players. ? Cell phones.  While wearing your monitor you should avoid: ? Electric blankets. ? Armed forces operational officer. ? Electric toothbrushes. ? Microwave ovens. ? Magnets. ? Metal detectors. Get help right away if:  You have chest pain.  You have shortness of breath or extreme difficulty breathing.  You develop a very fast heartbeat that does not get better.  You develop dizziness that does not go away.  You faint or constantly feel like you are about to faint. Summary  An ambulatory cardiac monitor is a small recording device that is used to detect abnormal heart rhythms (arrhythmias).  Make sure you understand how to send the information from the monitor to your health care provider.  It is important to press the button on the monitor when you have any heart-related symptoms.  Keep a diary of your activities, such as walking, doing chores, and taking medicine. It is very important to note what you were doing when you pushed the button to record your symptoms. This will help your health care provider learn what might be causing your symptoms. This information is not intended to replace advice given to you by your health care provider. Make sure you discuss any questions you have with your health care provider. Document Released: 08/01/2008 Document Revised: 10/05/2017 Document Reviewed: 10/07/2016 Elsevier Patient Education  2020 [employer].

## 2019-08-14 ENCOUNTER — Telehealth: Payer: Self-pay | Admitting: *Deleted

## 2019-08-14 ENCOUNTER — Other Ambulatory Visit: Payer: Self-pay

## 2019-08-14 DIAGNOSIS — Z20822 Contact with and (suspected) exposure to covid-19: Secondary | ICD-10-CM

## 2019-08-14 NOTE — Telephone Encounter (Signed)
[employer] to ship a 30 day cardiac event monitor to the patients home.  Instructions reviewed briefly as they are included in the monitor kit. 

## 2019-08-15 LAB — NOVEL CORONAVIRUS, NAA: SARS-CoV-2, NAA: NOT DETECTED

## 2019-08-20 ENCOUNTER — Ambulatory Visit
Admission: RE | Admit: 2019-08-20 | Discharge: 2019-08-20 | Disposition: A | Discharge status: Home / Self Care | Payer: Medicare Other | Source: Ambulatory Visit | Attending: Family Medicine | Admitting: Family Medicine

## 2019-08-20 ENCOUNTER — Ambulatory Visit (INDEPENDENT_AMBULATORY_CARE_PROVIDER_SITE_OTHER): Payer: Medicare Other

## 2019-08-20 ENCOUNTER — Other Ambulatory Visit: Payer: Self-pay

## 2019-08-20 DIAGNOSIS — R002 Palpitations: Secondary | ICD-10-CM

## 2019-08-20 DIAGNOSIS — M810 Age-related osteoporosis without current pathological fracture: Secondary | ICD-10-CM

## 2019-09-15 ENCOUNTER — Ambulatory Visit: Payer: Medicare Other | Admitting: Podiatry

## 2019-09-17 ENCOUNTER — Ambulatory Visit: Payer: Medicare Other | Admitting: Podiatry

## 2019-09-30 ENCOUNTER — Telehealth: Payer: Self-pay | Admitting: Interventional Cardiology

## 2019-09-30 NOTE — Telephone Encounter (Signed)
New message   Patient wants to know next step in care. Patient requesting monitor results  Please call

## 2019-09-30 NOTE — Telephone Encounter (Signed)
Attempted to contact patient but there was no answer and VM did not pick up. 

## 2019-10-09 NOTE — Telephone Encounter (Signed)
Patient was notified of monitor results on 11/25

## 2019-11-21 ENCOUNTER — Ambulatory Visit: Payer: Medicare Other | Attending: Internal Medicine

## 2019-11-21 DIAGNOSIS — Z23 Encounter for immunization: Secondary | ICD-10-CM | POA: Insufficient documentation

## 2019-11-21 NOTE — Progress Notes (Signed)
   Covid-19 Vaccination Clinic  Name:  Alan Rivera    MRN: [id number] DOB: [birth year]  11/21/2019  [name] was observed post Covid-19 immunization for 15 minutes without incidence. He was provided with Vaccine Information Sheet and instruction to access the V-Safe system.   [name] was instructed to call 911 with any severe reactions post vaccine: Marland Kitchen Difficulty breathing  . Swelling of your face and throat  . A fast heartbeat  . A bad rash all over your body  . Dizziness and weakness    Immunizations Administered    Name Date Dose VIS Date Route   Pfizer COVID-19 Vaccine 11/21/2019 10:58 AM 0.3 mL 10/17/2019 Intramuscular   Manufacturer: [place]   Lot: [id number]   [place]: [id number]

## 2019-12-12 ENCOUNTER — Ambulatory Visit: Payer: Medicare Other | Attending: Internal Medicine

## 2019-12-12 DIAGNOSIS — Z23 Encounter for immunization: Secondary | ICD-10-CM | POA: Insufficient documentation

## 2019-12-12 NOTE — Progress Notes (Signed)
   Covid-19 Vaccination Clinic  Name:  Vicente Kirkpatrick    MRN: [id number] DOB: 1946-03-10  12/12/2019  Mr. Foth was observed post Covid-19 immunization for 15 minutes without incidence. He was provided with Vaccine Information Sheet and instruction to access the V-Safe system.   Mr. Sterling was instructed to call 911 with any severe reactions post vaccine: Marland Kitchen Difficulty breathing  . Swelling of your face and throat  . A fast heartbeat  . A bad rash all over your body  . Dizziness and weakness    Immunizations Administered    Name Date Dose VIS Date Route   Pfizer COVID-19 Vaccine 12/12/2019 10:54 AM 0.3 mL 10/17/2019 Intramuscular   Manufacturer: [place]   Lot: [id number]   [place]: [id number]

## 2020-04-10 ENCOUNTER — Ambulatory Visit: Payer: Self-pay | Admitting: General Surgery

## 2020-04-14 NOTE — Patient Instructions (Addendum)
DUE TO COVID-19 ONLY ONE VISITOR IS ALLOWED TO COME WITH YOU AND STAY IN THE WAITING ROOM ONLY DURING PRE OP AND PROCEDURE DAY OF SURGERY. THE 1 VISITOR MAY VISIT WITH YOU AFTER SURGERY IN YOUR PRIVATE ROOM DURING VISITING HOURS ONLY!   ONCE YOUR COVID TEST IS COMPLETED, PLEASE BEGIN THE QUARANTINE INSTRUCTIONS AS OUTLINED IN YOUR HANDOUT.                [place]   Your procedure is scheduled on: 04/19/20   Report to [facility] Main  Entrance   Report to admitting at  12:15 pm     Call this number if you have problems the morning of surgery [place], NO CHEWING GUM [place].   Do not eat food After Midnight.   YOU MAY HAVE CLEAR LIQUIDS FROM MIDNIGHT UNTIL 11:00 AM.    CLEAR LIQUID DIET   Foods Allowed                                                                     Foods Excluded  Coffee and tea, regular and decaf                             liquids that you cannot  Plain Jell-O any favor except red or purple                                           see through such as: Fruit ices (not with fruit pulp)                                     milk, soups, orange juice  Iced Popsicles                                    All solid food Carbonated beverages, regular and diet                                    Cranberry, grape and apple juices Sports drinks like Gatorade Lightly seasoned clear broth or consume(fat free) Sugar, honey syrup      At 11:00 AM Please finish the prescribed Pre-Surgery  drink  . Nothing by mouth after you finish the  drink !    Take these medicines the morning of surgery with A SIP OF WATER: None                                 You may not have any metal on your body including              piercings  Do not wear jewelry,  lotions, powders or deodorant  Men may shave face and neck.   Do not bring valuables to the hospital. [place].  Contacts, dentures or bridgework may not be worn into surgery.      Patients discharged the day of surgery will not be allowed to drive home.   IF YOU ARE HAVING SURGERY AND GOING HOME THE SAME DAY, YOU MUST HAVE AN ADULT TO DRIVE YOU HOME AND BE WITH YOU FOR 24 HOURS.   YOU MAY GO HOME BY TAXI OR UBER OR ORTHERWISE, BUT AN ADULT MUST ACCOMPANY YOU HOME AND STAY WITH YOU FOR 24 HOURS.  Name and phone number of your driver:  Special Instructions: N/A              Please read over the following fact sheets you were given: _____________________________________________________________________             [facility] - Preparing for Surgery Before surgery, you can play an important role.   Because skin is not sterile, your skin needs to be as free of germs as possible.   You can reduce the number of germs on your skin by washing with CHG (chlorahexidine gluconate) soap before surgery.   CHG is an antiseptic cleaner which kills germs and bonds with the skin to continue killing germs even after washing. Please DO NOT use if you have an allergy to CHG or antibacterial soaps.   If your skin becomes reddened/irritated stop using the CHG and inform your nurse when you arrive at Short Stay.   You may shave your face/neck.  Please follow these instructions carefully:  1.  Shower with CHG Soap the night before surgery and the  morning of Surgery.  2.  If you choose to wash your hair, wash your hair first as usual with your  normal  shampoo.  3.  After you shampoo, rinse your hair and body thoroughly to remove the  shampoo.                                        4.  Use CHG as you would any other liquid soap.  You can apply chg directly  to the skin and wash                       Gently with a scrungie or clean washcloth.  5.  Apply the CHG Soap to your body ONLY FROM THE NECK DOWN.   Do not use on face/ open                           Wound  or open sores. Avoid contact with eyes, ears mouth and genitals (private parts).                       Wash face,  Genitals (private parts) with your normal soap.             6.  Wash thoroughly, paying special attention to the area where your surgery  will be performed.  7.  Thoroughly rinse your body with warm water from the neck down.  8.  DO NOT shower/wash with your normal soap after using and rinsing off  the CHG Soap.  9.  Pat yourself dry with a clean towel.            10.  Wear clean pajamas.            11.  Place clean sheets on your bed the night of your first shower and do not  sleep with pets. Day of Surgery : Do not apply any lotions/deodorants the morning of surgery.  Please wear clean clothes to the hospital/surgery center.  FAILURE TO FOLLOW THESE INSTRUCTIONS MAY RESULT IN THE CANCELLATION OF YOUR SURGERY PATIENT SIGNATURE_________________________________  NURSE SIGNATURE__________________________________  ________________________________________________________________________

## 2020-04-15 ENCOUNTER — Encounter (HOSPITAL_COMMUNITY)
Admission: RE | Admit: 2020-04-15 | Discharge: 2020-04-15 | Disposition: A | Discharge status: Home / Self Care | Payer: Medicare Other | Source: Ambulatory Visit | Attending: General Surgery | Admitting: General Surgery

## 2020-04-15 ENCOUNTER — Encounter (HOSPITAL_COMMUNITY): Payer: Self-pay

## 2020-04-15 ENCOUNTER — Other Ambulatory Visit (HOSPITAL_COMMUNITY)
Admission: RE | Admit: 2020-04-15 | Discharge: 2020-04-15 | Disposition: A | Discharge status: Home / Self Care | Payer: Medicare Other | Source: Ambulatory Visit | Attending: General Surgery | Admitting: General Surgery

## 2020-04-15 ENCOUNTER — Other Ambulatory Visit: Payer: Self-pay

## 2020-04-15 DIAGNOSIS — Z20822 Contact with and (suspected) exposure to covid-19: Secondary | ICD-10-CM | POA: Diagnosis not present

## 2020-04-15 DIAGNOSIS — Z01812 Encounter for preprocedural laboratory examination: Secondary | ICD-10-CM | POA: Insufficient documentation

## 2020-04-15 LAB — COMPREHENSIVE METABOLIC PANEL WITH GFR
ALT: 17 U/L (ref 0–44)
AST: 20 U/L (ref 15–41)
Albumin: 3.6 g/dL (ref 3.5–5.0)
Alkaline Phosphatase: 53 U/L (ref 38–126)
Anion gap: 5 (ref 5–15)
BUN: 21 mg/dL (ref 8–23)
CO2: 25 mmol/L (ref 22–32)
Calcium: 8.6 mg/dL — ABNORMAL LOW (ref 8.9–10.3)
Chloride: 111 mmol/L (ref 98–111)
Creatinine, Ser: 0.85 mg/dL (ref 0.61–1.24)
GFR calc Af Amer: >60 mL/min
GFR calc non Af Amer: >60 mL/min
Glucose, Bld: 116 mg/dL — ABNORMAL HIGH (ref 70–99)
Potassium: 3.9 mmol/L (ref 3.5–5.1)
Sodium: 141 mmol/L (ref 135–145)
Total Bilirubin: 0.6 mg/dL (ref 0.3–1.2)
Total Protein: 5.9 g/dL — ABNORMAL LOW (ref 6.5–8.1)

## 2020-04-15 LAB — CBC WITH DIFFERENTIAL/PLATELET
Abs Immature Granulocytes: 0.01 10*3/uL (ref 0.00–0.07)
Basophils Absolute: 0 10*3/uL (ref 0.0–0.1)
Basophils Relative: 1 %
Eosinophils Absolute: 0.2 10*3/uL (ref 0.0–0.5)
Eosinophils Relative: 4 %
HCT: 40.8 % (ref 39.0–52.0)
Hemoglobin: 13.5 g/dL (ref 13.0–17.0)
Immature Granulocytes: 0 %
Lymphocytes Relative: 29 %
Lymphs Abs: 1.6 10*3/uL (ref 0.7–4.0)
MCH: 30.8 pg (ref 26.0–34.0)
MCHC: 33.1 g/dL (ref 30.0–36.0)
MCV: 93.2 fL (ref 80.0–100.0)
Monocytes Absolute: 0.6 10*3/uL (ref 0.1–1.0)
Monocytes Relative: 11 %
Neutro Abs: 3.1 10*3/uL (ref 1.7–7.7)
Neutrophils Relative %: 55 %
Platelets: 176 10*3/uL (ref 150–400)
RBC: 4.38 MIL/uL (ref 4.22–5.81)
RDW: 13.6 % (ref 11.5–15.5)
WBC: 5.6 10*3/uL (ref 4.0–10.5)
nRBC: 0 % (ref 0.0–0.2)

## 2020-04-15 LAB — SARS CORONAVIRUS 2 (TAT 6-24 HRS): SARS Coronavirus 2: NEGATIVE

## 2020-04-15 NOTE — Progress Notes (Signed)
COVID Vaccine Completed:Yes Date COVID Vaccine completed:12/12/19 COVID vaccine manufacturer: Pfizer      PCP - Dr. Lawernce Pitts Cardiologist - Dr. Irish Lack  Chest x-ray - 11/28/19 EKG - 08/16/19 Stress Test -no  ECHO - no Cardiac Cath -no   Sleep Study - no CPAP -   Fasting Blood Sugar - NA Checks Blood Sugar _____ times a day  Blood Thinner Instructions:NA Aspirin Instructions: Last Dose:  Anesthesia review:   Patient denies shortness of breath, fever, cough and chest pain at PAT appointment yes  Patient verbalized understanding of instructions that were given to them at the PAT appointment. Patient was also instructed that they will need to review over the PAT instructions again at home before surgery. yes

## 2020-04-19 ENCOUNTER — Ambulatory Visit (HOSPITAL_COMMUNITY): Payer: Medicare Other | Admitting: Certified Registered"

## 2020-04-19 ENCOUNTER — Ambulatory Visit (HOSPITAL_COMMUNITY)
Admission: RE | Admit: 2020-04-19 | Discharge: 2020-04-19 | Disposition: A | Discharge status: Home / Self Care | Payer: Medicare Other | Attending: General Surgery | Admitting: General Surgery

## 2020-04-19 ENCOUNTER — Encounter (HOSPITAL_COMMUNITY)
Admission: RE | Disposition: A | Discharge status: Home / Self Care | Payer: Self-pay | Source: Home / Self Care | Attending: General Surgery

## 2020-04-19 ENCOUNTER — Other Ambulatory Visit: Payer: Self-pay

## 2020-04-19 ENCOUNTER — Encounter (HOSPITAL_COMMUNITY): Payer: Self-pay | Admitting: General Surgery

## 2020-04-19 DIAGNOSIS — Z7983 Long term (current) use of bisphosphonates: Secondary | ICD-10-CM | POA: Diagnosis not present

## 2020-04-19 DIAGNOSIS — K429 Umbilical hernia without obstruction or gangrene: Secondary | ICD-10-CM | POA: Insufficient documentation

## 2020-04-19 DIAGNOSIS — Z79899 Other long term (current) drug therapy: Secondary | ICD-10-CM | POA: Insufficient documentation

## 2020-04-19 DIAGNOSIS — I10 Essential (primary) hypertension: Secondary | ICD-10-CM | POA: Insufficient documentation

## 2020-04-19 DIAGNOSIS — Z8546 Personal history of malignant neoplasm of prostate: Secondary | ICD-10-CM | POA: Diagnosis not present

## 2020-04-19 DIAGNOSIS — H9190 Unspecified hearing loss, unspecified ear: Secondary | ICD-10-CM | POA: Insufficient documentation

## 2020-04-19 DIAGNOSIS — R531 Weakness: Secondary | ICD-10-CM | POA: Diagnosis not present

## 2020-04-19 DIAGNOSIS — B91 Sequelae of poliomyelitis: Secondary | ICD-10-CM | POA: Diagnosis not present

## 2020-04-19 DIAGNOSIS — Z87891 Personal history of nicotine dependence: Secondary | ICD-10-CM | POA: Insufficient documentation

## 2020-04-19 SURGERY — REPAIR, HERNIA, UMBILICAL, ROBOT-ASSISTED
Anesthesia: General

## 2020-04-19 MED ORDER — KETOROLAC TROMETHAMINE 15 MG/ML IJ SOLN
INTRAMUSCULAR | Status: DC | PRN
Start: 2020-04-19 — End: 2020-04-19
  Administered 2020-04-19: 15 mg via INTRAVENOUS

## 2020-04-19 MED ORDER — EPHEDRINE SULFATE-NACL 50-0.9 MG/10ML-% IV SOSY
PREFILLED_SYRINGE | INTRAVENOUS | Status: DC | PRN
  Administered 2020-04-19: 10 mg via INTRAVENOUS

## 2020-04-19 MED ORDER — ENSURE PRE-SURGERY PO LIQD
296.0000 mL | Freq: Once | ORAL | Status: DC
  Filled 2020-04-19: qty 296

## 2020-04-19 MED ORDER — CHLORHEXIDINE GLUCONATE CLOTH 2 % EX PADS: 6.0000 | MEDICATED_PAD | Freq: Once | CUTANEOUS | Status: DC

## 2020-04-19 MED ORDER — FENTANYL CITRATE (PF) 100 MCG/2ML IJ SOLN
INTRAMUSCULAR | Status: AC
  Filled 2020-04-19: qty 2

## 2020-04-19 MED ORDER — SUGAMMADEX SODIUM 200 MG/2ML IV SOLN
INTRAVENOUS | Status: DC | PRN
  Administered 2020-04-19: 200 mg via INTRAVENOUS

## 2020-04-19 MED ORDER — LIDOCAINE 2% (20 MG/ML) 5 ML SYRINGE
INTRAMUSCULAR | Status: AC
  Filled 2020-04-19: qty 15

## 2020-04-19 MED ORDER — PROMETHAZINE HCL 25 MG/ML IJ SOLN: 6.2500 mg | INTRAMUSCULAR | Status: DC | PRN

## 2020-04-19 MED ORDER — OXYCODONE HCL 5 MG PO TABS
ORAL_TABLET | ORAL | Status: AC
  Filled 2020-04-19: qty 1

## 2020-04-19 MED ORDER — CEFAZOLIN SODIUM-DEXTROSE 2-4 GM/100ML-% IV SOLN
2.0000 g | INTRAVENOUS | Status: AC
  Administered 2020-04-19: 2 g via INTRAVENOUS
  Filled 2020-04-19: qty 100

## 2020-04-19 MED ORDER — OXYCODONE HCL 5 MG PO TABS: 5.0000 mg | ORAL_TABLET | Freq: Four times a day (QID) | ORAL | 0 refills | Status: AC | PRN

## 2020-04-19 MED ORDER — KETOROLAC TROMETHAMINE 30 MG/ML IJ SOLN
INTRAMUSCULAR | Status: AC
  Filled 2020-04-19: qty 1

## 2020-04-19 MED ORDER — FENTANYL CITRATE (PF) 100 MCG/2ML IJ SOLN
INTRAMUSCULAR | Status: DC | PRN
  Administered 2020-04-19: 100 ug via INTRAVENOUS

## 2020-04-19 MED ORDER — PROPOFOL 10 MG/ML IV BOLUS
INTRAVENOUS | Status: AC
  Filled 2020-04-19: qty 20

## 2020-04-19 MED ORDER — LIDOCAINE 20MG/ML (2%) 15 ML SYRINGE OPTIME
INTRAMUSCULAR | Status: DC | PRN
  Administered 2020-04-19: 1.5 mg/kg/h via INTRAVENOUS

## 2020-04-19 MED ORDER — LACTATED RINGERS IV SOLN: INTRAVENOUS | Status: DC

## 2020-04-19 MED ORDER — FENTANYL CITRATE (PF) 100 MCG/2ML IJ SOLN
INTRAMUSCULAR | Status: AC
  Administered 2020-04-19: 50 ug via INTRAVENOUS
  Filled 2020-04-19: qty 2

## 2020-04-19 MED ORDER — OXYCODONE HCL 5 MG PO TABS
5.0000 mg | ORAL_TABLET | Freq: Once | ORAL | Status: AC
  Administered 2020-04-19: 5 mg via ORAL

## 2020-04-19 MED ORDER — CHLORHEXIDINE GLUCONATE 0.12 % MT SOLN
15.0000 mL | Freq: Once | OROMUCOSAL | Status: AC
  Administered 2020-04-19: 15 mL via OROMUCOSAL

## 2020-04-19 MED ORDER — ONDANSETRON HCL 4 MG/2ML IJ SOLN
INTRAMUSCULAR | Status: AC
  Filled 2020-04-19: qty 2

## 2020-04-19 MED ORDER — ORAL CARE MOUTH RINSE: 15.0000 mL | Freq: Once | OROMUCOSAL | Status: AC

## 2020-04-19 MED ORDER — ROCURONIUM BROMIDE 10 MG/ML (PF) SYRINGE
PREFILLED_SYRINGE | INTRAVENOUS | Status: AC
  Filled 2020-04-19: qty 10

## 2020-04-19 MED ORDER — ROCURONIUM BROMIDE 100 MG/10ML IV SOLN
INTRAVENOUS | Status: DC | PRN
  Administered 2020-04-19: 60 mg via INTRAVENOUS

## 2020-04-19 MED ORDER — LIDOCAINE 2% (20 MG/ML) 5 ML SYRINGE
INTRAMUSCULAR | Status: AC
  Filled 2020-04-19: qty 5

## 2020-04-19 MED ORDER — LIDOCAINE HCL (CARDIAC) PF 100 MG/5ML IV SOSY
PREFILLED_SYRINGE | INTRAVENOUS | Status: DC | PRN
  Administered 2020-04-19: 80 mg via INTRAVENOUS

## 2020-04-19 MED ORDER — ACETAMINOPHEN 500 MG PO TABS
1000.0000 mg | ORAL_TABLET | Freq: Three times a day (TID) | ORAL | 0 refills | Status: AC
Start: 2020-04-19 — End: 2020-04-24

## 2020-04-19 MED ORDER — ALBUMIN HUMAN 5 % IV SOLN
INTRAVENOUS | Status: AC
  Filled 2020-04-19: qty 500

## 2020-04-19 MED ORDER — GLYCOPYRROLATE PF 0.2 MG/ML IJ SOSY
PREFILLED_SYRINGE | INTRAMUSCULAR | Status: DC | PRN
Start: 2020-04-19 — End: 2020-04-19
  Administered 2020-04-19: .2 mg via INTRAVENOUS

## 2020-04-19 MED ORDER — MIDAZOLAM HCL 2 MG/2ML IJ SOLN
INTRAMUSCULAR | Status: AC
  Filled 2020-04-19: qty 2

## 2020-04-19 MED ORDER — BUPIVACAINE HCL 0.25 % IJ SOLN
INTRAMUSCULAR | Status: DC | PRN
  Administered 2020-04-19: 35 mL

## 2020-04-19 MED ORDER — FENTANYL CITRATE (PF) 100 MCG/2ML IJ SOLN
25.0000 ug | INTRAMUSCULAR | Status: DC | PRN
  Administered 2020-04-19 (×2): 50 ug via INTRAVENOUS

## 2020-04-19 MED ORDER — DIPHENHYDRAMINE HCL 50 MG/ML IJ SOLN
INTRAMUSCULAR | Status: AC
  Filled 2020-04-19: qty 1

## 2020-04-19 MED ORDER — 0.9 % SODIUM CHLORIDE (POUR BTL) OPTIME
TOPICAL | Status: DC | PRN
  Administered 2020-04-19: 1000 mL

## 2020-04-19 MED ORDER — ONDANSETRON HCL 4 MG/2ML IJ SOLN
INTRAMUSCULAR | Status: DC | PRN
  Administered 2020-04-19: 4 mg via INTRAVENOUS

## 2020-04-19 MED ORDER — ACETAMINOPHEN 500 MG PO TABS
1000.0000 mg | ORAL_TABLET | ORAL | Status: AC
  Administered 2020-04-19: 1000 mg via ORAL
  Filled 2020-04-19: qty 2

## 2020-04-19 MED ORDER — BUPIVACAINE HCL 0.25 % IJ SOLN
INTRAMUSCULAR | Status: AC
  Filled 2020-04-19: qty 1

## 2020-04-19 MED ORDER — PROPOFOL 10 MG/ML IV BOLUS
INTRAVENOUS | Status: DC | PRN
  Administered 2020-04-19: 170 mg via INTRAVENOUS

## 2020-04-19 SURGICAL SUPPLY — 67 items
APPLICATOR COTTON TIP 6 STRL (MISCELLANEOUS) ×2 IMPLANT
APPLICATOR COTTON TIP 6IN STRL (MISCELLANEOUS) ×6
APPLIER CLIP 5 13 M/L LIGAMAX5 (MISCELLANEOUS)
APPLIER CLIP ROT 10 11.4 M/L (STAPLE)
BLADE SURG SZ11 CARB STEEL (BLADE) ×3 IMPLANT
CHLORAPREP W/TINT 26 (MISCELLANEOUS) ×3 IMPLANT
CLIP APPLIE 5 13 M/L LIGAMAX5 (MISCELLANEOUS) IMPLANT
CLIP APPLIE ROT 10 11.4 M/L (STAPLE) IMPLANT
CLIP VESOLOCK LG 6/CT PURPLE (CLIP) IMPLANT
COVER TIP SHEARS 8 DVNC (MISCELLANEOUS) ×1 IMPLANT
COVER TIP SHEARS 8MM DA VINCI (MISCELLANEOUS) ×3
COVER WAND RF STERILE (DRAPES) ×3 IMPLANT
DECANTER SPIKE VIAL GLASS SM (MISCELLANEOUS) ×3 IMPLANT
DERMABOND ADVANCED (GAUZE/BANDAGES/DRESSINGS)
DERMABOND ADVANCED .7 DNX12 (GAUZE/BANDAGES/DRESSINGS) IMPLANT
DEVICE TROCAR PUNCTURE CLOSURE (ENDOMECHANICALS) IMPLANT
DRAPE ARM DVNC X/XI (DISPOSABLE) ×4 IMPLANT
DRAPE COLUMN DVNC XI (DISPOSABLE) ×1 IMPLANT
DRAPE DA VINCI XI ARM (DISPOSABLE) ×12
DRAPE DA VINCI XI COLUMN (DISPOSABLE) ×3
DRAPE WARM FLUID 44X44 (DRAPES) IMPLANT
ELECT REM PT RETURN 15FT ADLT (MISCELLANEOUS) ×3 IMPLANT
GAUZE 4X4 16PLY RFD (DISPOSABLE) IMPLANT
GAUZE SPONGE 2X2 8PLY STRL LF (GAUZE/BANDAGES/DRESSINGS) IMPLANT
GLOVE BIO SURGEON STRL SZ 6 (GLOVE) ×6 IMPLANT
GLOVE INDICATOR 6.5 STRL GRN (GLOVE) ×6 IMPLANT
GOWN STRL REUS W/TWL LRG LVL3 (GOWN DISPOSABLE) ×6 IMPLANT
GOWN STRL REUS W/TWL XL LVL3 (GOWN DISPOSABLE) IMPLANT
GRASPER SUT TROCAR 14GX15 (MISCELLANEOUS) IMPLANT
IRRIGATOR SUCT 8 DISP DVNC XI (IRRIGATION / IRRIGATOR) IMPLANT
IRRIGATOR SUCTION 8MM XI DISP (IRRIGATION / IRRIGATOR)
KIT BASIN (CUSTOM PROCEDURE TRAY) ×3 IMPLANT
KIT TURNOVER KIT A (KITS) IMPLANT
MANIFOLD NEPTUNE II (INSTRUMENTS) ×3 IMPLANT
MARKER SKIN DUAL TIP RULER LAB (MISCELLANEOUS) ×3 IMPLANT
MESH VENTRALEX ST 8CM LRG (Mesh General) ×3 IMPLANT
NEEDLE HYPO 22GX1.5 SAFETY (NEEDLE) ×3 IMPLANT
NEEDLE INSUFFLATION 14GA 120MM (NEEDLE) ×3 IMPLANT
PACK CARDIOVASCULAR III (CUSTOM PROCEDURE TRAY) ×3 IMPLANT
PENCIL SMOKE EVACUATOR (MISCELLANEOUS) IMPLANT
SCISSORS LAP 5X35 DISP (ENDOMECHANICALS) IMPLANT
SEAL CANN UNIV 5-8 DVNC XI (MISCELLANEOUS) ×3 IMPLANT
SEAL XI 5MM-8MM UNIVERSAL (MISCELLANEOUS) ×9
SEALER VESSEL DA VINCI XI (MISCELLANEOUS)
SEALER VESSEL EXT DVNC XI (MISCELLANEOUS) IMPLANT
SET IRRIG TUBING LAPAROSCOPIC (IRRIGATION / IRRIGATOR) IMPLANT
SOL ANTI FOG 6CC (MISCELLANEOUS) ×1 IMPLANT
SOLUTION ANTI FOG 6CC (MISCELLANEOUS) ×2
SOLUTION ELECTROLUBE (MISCELLANEOUS) ×3 IMPLANT
SPONGE GAUZE 2X2 STER 10/PKG (GAUZE/BANDAGES/DRESSINGS)
SPONGE LAP 18X18 RF (DISPOSABLE) ×3 IMPLANT
SUT DVC VLOC 180 0 12IN GS21 (SUTURE) ×3
SUT MNCRL AB 4-0 PS2 18 (SUTURE) ×3 IMPLANT
SUT STRATAFIX 1PDS 45CM VIOLET (SUTURE) IMPLANT
SUT STRATAFIX PDS 30 CT-1 (SUTURE) ×3 IMPLANT
SUT VIC AB 3-0 SH 27 (SUTURE)
SUT VIC AB 3-0 SH 27XBRD (SUTURE) IMPLANT
SUT VLOC 180 3-0 9IN GS21 (SUTURE) IMPLANT
SUTURE DVC VLC 180 0 12IN GS21 (SUTURE) ×1 IMPLANT
SYR CONTROL 10ML LL (SYRINGE) ×3 IMPLANT
SYS RETRIEVAL 5MM INZII UNIV (BASKET)
SYSTEM RETRIEVL 5MM INZII UNIV (BASKET) IMPLANT
TOWEL OR 17X26 10 PK STRL BLUE (TOWEL DISPOSABLE) ×3 IMPLANT
TOWEL OR NON WOVEN STRL DISP B (DISPOSABLE) ×3 IMPLANT
TRAY FOLEY MTR SLVR 16FR STAT (SET/KITS/TRAYS/PACK) IMPLANT
TROCAR BLADELESS OPT 5 100 (ENDOMECHANICALS) IMPLANT
TUBING INSUFFLATION 10FT LAP (TUBING) ×3 IMPLANT

## 2020-04-19 NOTE — Anesthesia Postprocedure Evaluation (Signed)
Anesthesia Post Note  Patient: Alan Rivera  Procedure(s) Performed: ROBOTIC ASSISTED UMBILICAL HERNIA REPAIR WITH MESH (N/A )     Patient location during evaluation: PACU Anesthesia Type: General Level of consciousness: sedated Pain management: pain level controlled Vital Signs Assessment: post-procedure vital signs reviewed and stable Respiratory status: spontaneous breathing and respiratory function stable Cardiovascular status: stable Postop Assessment: no apparent nausea or vomiting Anesthetic complications: no   No complications documented.  Last Vitals:  Vitals:   04/19/20 1845 04/19/20 1857  BP: 129/87   Pulse: 67 66  Resp: 13 12  Temp:    SpO2: 99% 96%    Last Pain:  Vitals:   04/19/20 1857  TempSrc:   PainSc: 4                  Alan Rivera

## 2020-04-19 NOTE — Transfer of Care (Signed)
Immediate Anesthesia Transfer of Care Note  Patient: Alan Rivera  Procedure(s) Performed: ROBOTIC ASSISTED UMBILICAL HERNIA REPAIR WITH MESH (N/A )  Patient Location: PACU  Anesthesia Type:General  Level of Consciousness: awake, alert  and oriented  Airway & Oxygen Therapy: Patient Spontanous Breathing and Patient connected to face mask oxygen  Post-op Assessment: Report given to RN and Post -op Vital signs reviewed and stable  Post vital signs: Reviewed and stable  Last Vitals:  Vitals Value Taken Time  BP 122/69 04/19/20 1730  Temp    Pulse 69 04/19/20 1731  Resp 8 04/19/20 1731  SpO2 100 % 04/19/20 1731  Vitals shown include unvalidated device data.  Last Pain:  Vitals:   04/19/20 1256  TempSrc:   PainSc: 0-No pain      Patients Stated Pain Goal: 4 (26/33/35 4562)  Complications: No complications documented.

## 2020-04-19 NOTE — H&P (Signed)
Alan Rivera is an 74 y.o. male.   Chief Complaint: here for surgery HPI: 74 year old male presents for elective repair of symptomatic umbilical hernia.  He denies any medical changes since I last saw him in the clinic.  He denies any fever, chills, nausea, vomiting.  Please see outside records for additional details  Past Medical History:  Diagnosis Date  . Bilateral renal cysts   . Diverticulosis   . ED (erectile dysfunction)   . Family history of malignant neoplasm of prostate   . Frequency-urgency syndrome   . HOH (hard of hearing)    left side  . HTN (hypertension) 09/18/2016  . Hypertension   . Impaired fasting glucose   . Irregular heartbeat   . Malignant neoplasm of prostate ([place]) 09/18/2016  . Osteoporosis   . 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 Prostate cancer ([place]) UROLOGIST-  [name]/  ONCOLOGIST -- [name]   dx 10/ 2017-- Stage T2a,  Gleason 4+5,  PSA 6.68, vol 43cc  . Umbilical hernia   . Uses walker    prn  . Wears glasses     Past Surgical History:  Procedure Laterality Date  . FOOT SURGERY Bilateral child   l related to effects of polio  . PROSTATE BIOPSY    . PROSTATE BIOPSY N/A 01/05/2017   Procedure: PROSTATE ULTRASOUND WITH SPACE OAR;  Surgeon: [name], MD;  [facility];  Service: Urology;  Laterality: N/A;  . SHOULDER ARTHROSCOPY WITH OPEN ROTATOR CUFF REPAIR Bilateral right 04/30/2007;  left 10-15-2007   w/ Debridement/  DCR/  SAD/  Acromioplasty/  Bursectomy  . TONSILLECTOMY AND ADENOIDECTOMY      Family History  Problem Relation Age of Onset  . Cancer Father        lung and prostate cancer  . Heart failure Mother        AFIB  . Heart disease Mother        AFIB   Social History:  reports that he quit smoking about 38  years ago. His smoking use included cigarettes. He has a 36.00 pack-year smoking history. He has never used smokeless tobacco. He reports current alcohol use of about 4.0 standard drinks of alcohol per week. He reports that he does not use drugs.  Allergies:  Allergies  Allergen Reactions  . Shellfish Allergy Anaphylaxis    Medications Prior to Admission  Medication Sig Dispense Refill  . alendronate (FOSAMAX) 70 MG tablet Take 70 mg by mouth once a week. Take with a full glass of water on an empty stomach.    . EPINEPHrine (EPIPEN JR 2-PAK) 0.15 MG/0.3ML injection Inject 0.15 mg into the muscle as needed for anaphylaxis.    Marland Kitchen lisinopril (PRINIVIL,ZESTRIL) 10 MG tablet Take 10 mg by mouth daily.    Alan Rivera 3-6-9 Fatty Acids (OMEGA-3-6-9 PO) Take 750 mg by mouth in the morning and at bedtime.      No results found for this or any previous visit (from the past 48 hour(s)). No results found.  Review of Systems  All other systems reviewed and are negative.   Blood pressure 127/80, pulse 60, temperature 98.4 F (36.9 C), temperature source Oral, resp. rate 18, height 5\' 7"  (1.702 m), weight 78.7 kg, SpO2 99 %. Physical Exam  Vitals reviewed. Constitutional: He  is oriented to person, place, and time. He appears well-developed. No distress.  HENT:  Head: Normocephalic and atraumatic.  Right Ear: External ear normal.  Left Ear: External ear normal.  Eyes: Conjunctivae are normal. No scleral icterus.  Neck: No tracheal deviation present. No thyromegaly present.  Cardiovascular: Normal rate and normal heart sounds.  Respiratory: Effort normal and breath sounds normal. No stridor. No respiratory distress. He has no wheezes.  GI: Soft. He exhibits no distension. There is no abdominal tenderness. A hernia is present. Hernia confirmed positive in the umbilical area.  Small umbilical hernia  Musculoskeletal:        General: No tenderness.     Cervical back: Normal range of motion and neck  supple.  Lymphadenopathy:    He has no cervical adenopathy.  Neurological: He is alert and oriented to person, place, and time. He exhibits normal muscle tone.  Skin: Skin is warm and dry. No rash noted. He is not diaphoretic. No erythema. No pallor.  Psychiatric: His behavior is normal. Judgment and thought content normal.     Assessment/Plan Umbilical hernia  2 OR for robotic repair of umbilical hernia with mesh IV antibiotic Oral eras medication All questions asked and answered  Leighton Ruff. Redmond Pulling, MD, FACS General, Bariatric, & Minimally Invasive Surgery [facility], [place]   Greer Pickerel, MD 04/19/2020, 3:37 PM

## 2020-04-19 NOTE — Anesthesia Preprocedure Evaluation (Addendum)
Anesthesia Evaluation  Patient identified by MRN, date of birth, ID band Patient awake    Reviewed: Allergy & Precautions, NPO status , Patient's Chart, lab work & pertinent test results  History of Anesthesia Complications Negative for: history of anesthetic complications  Airway Mallampati: II  TM Distance: >3 FB Neck ROM: Full    Dental no notable dental hx. (+) Dental Advisory Given   Pulmonary former smoker,    Pulmonary exam normal        Cardiovascular hypertension, Pt. on medications Normal cardiovascular exam     Neuro/Psych Post polio syndrome  Neuromuscular disease (Post-polio syndrome with diffuse bilateral upper and lower ext weakness)    GI/Hepatic negative GI ROS, Neg liver ROS,   Endo/Other  negative endocrine ROS  Renal/GU Renal diseaseS/P prostatectomy   Prostate CA    Musculoskeletal   Abdominal   Peds  Hematology negative hematology ROS (+)   Anesthesia Other Findings   Reproductive/Obstetrics                           Anesthesia Physical  Anesthesia Plan  ASA: II  Anesthesia Plan: General   Post-op Pain Management:    Induction: Intravenous  PONV Risk Score and Plan: 3 and Ondansetron, Dexamethasone and Diphenhydramine  Airway Management Planned: Oral ETT  Additional Equipment:   Intra-op Plan:   Post-operative Plan: Extubation in OR  Informed Consent: I have reviewed the patients History and Physical, chart, labs and discussed the procedure including the risks, benefits and alternatives for the proposed anesthesia with the patient or authorized representative who has indicated his/her understanding and acceptance.     Dental advisory given  Plan Discussed with: Anesthesiologist and CRNA  Anesthesia Plan Comments:       Anesthesia Quick Evaluation

## 2020-04-19 NOTE — Op Note (Addendum)
Alan Rivera [id number] 08/10/1946 04/19/2020   Robotic Repair of  Umbilical Hernia with Mesh Procedure Note  Indications: Symptomatic umbilical hernia  Pre-operative Diagnosis: umbilical hernia  Post-operative Diagnosis: umbilical hernia  Surgeon: Greer Pickerel MD FACS  Assistants: Annye English MD FACS  Anesthesia: General endotracheal anesthesia   Procedure Details  The patient was seen in the Holding Room. The risks, benefits, complications, treatment options, and expected outcomes were discussed with the patient. The possibilities of reaction to medication, pulmonary aspiration, perforation of viscus, bleeding, recurrent infection, the need for additional procedures, failure to diagnose a condition, and creating a complication requiring transfusion or operation were discussed with the patient. The patient concurred with the proposed plan, giving informed consent.  The site of surgery properly noted/marked. The patient was taken to the operating room, identified as Alan Rivera  and the procedure verified as Robotic Umbilical Hernia Repair with mesh. A Time Out was held and the above information confirmed.    The patient was placed supine.  After establishing general anesthesia  Both arms tucked with the appropriate padding. A jelly roll was placed under his left side.   The abdomen was prepped with Chloraprep and draped in standard fashion. A veress needled was inserted at palmers point after small nick in skin. Saline drop test was performed which showed easy passage of saline.  CO2 insufflation connected. Initial pressure 3 mmHg. Pneumoperitoneum established up to 31mmHg without change in vitals. An 66mm robotic trocar was then introduced in LUQ laterally. endoscope advanced and the abdominal cavity was surveilled. No evidence of injury to surrounding structures. No adhesions to the anterior abdominal wall. An 74mm robotic trocar was placed about 2cm medial and above the ASIS in LLQ.  Another 8 mm robotic trocar was placed in left lateral abdomen b/t the 2 previously placed trocars resulting in about 8cm b/t the trocars. The nurse then deployed the [place] robot and the robotic arms were docked to the trocars. Endoscope advanced the anatomy was targeted.  A fenestrated bipolar was placed in arm 2 and hook cautery in arm 4. Trocars were burped and I went to the console while my assistant stayed at the bedside. There were no significant omental adhesions to the anterior abdominal wall in and around the hernia defect.     We visualized 1 fascial defect at the umbilicus. It measured 1.5 cm. I did clear some thickened prepertoneal fat infraumbilically so the mesh would lay flat against the abdominal wall. This was done with hook cautery.  My assistant then placed a 0 stratafix symmetric suture thru the trocar and exchanged the hook cautery for a robotic needle driver. I then closed the defect transversely with multiple passes of the stratafix suture needle and then went back several throws to lock the suture in place. My assistant then obtained a round Bard 8 cm ventralightST hernia patch and trimmed the pull up tabs flush at the mesh surface. He removed the needle driver and placed the mesh in the abdomen.  After the needle driver was replaced I used the stratafix suture to pass the needle thru the midportion/center of the mesh back thru the abdominal wall to anchor the mesh in position over the middle of the closed umbilical fascial defect. The suture was trimmed and my assistant removed the needle. He then inserted a 2-0 vloc twelve inch suture and replaced the robotic needled driver.  Starting a the 9 oclock position I ran the vloc suture circumferentially around the edge of the mesh  to the peritoneum in a dolphin manner. Once I had gone all the way around, I then ran the suture backwards a few throws to lock the suture in place. My assistant then removed the cut needle.  The mesh was flush against the  abdominal wall. No space for bowel to get trapped b/t the mesh and abdominal wall. Edges flush.  Robotic instruments removed and the robot undocked. I scrubbed back in. My assistant and I then infiltrated local and inspected the 28mm trocar sites. None had been stretched out leaving an enlarged fascial defect.     The port sites were closed with 4-0 Monocryl.  All of the incisions  were then covered  steri-strips and bandages.     The patient was extubated and brought to the recovery room in stable condition.  All sponge, instrument, and needle counts were correct prior to closure and at the conclusion of the case.   Dr Wendie Simmer observed my case.   Findings: Type of repair - primary suture with mesh underlay Name of mesh - Bard VentralightST hernia pach  Size of mesh - 8 cm  Mesh overlap - 4cm  Placement of mesh - beneath fascia and into peritoneal cavity,  Estimated Blood Loss:  Minimal         Complications:  None; patient tolerated the procedure well.         Disposition: PACU - hemodynamically stable.         Condition: stable  Leighton Ruff. Redmond Pulling, MD, FACS General, Bariatric, & Minimally Invasive Surgery [facility], [place]

## 2020-04-19 NOTE — Anesthesia Procedure Notes (Signed)
Procedure Name: Intubation Date/Time: 04/19/2020 4:08 PM Performed by: [place] Romana Deaton C, CRNA Pre-anesthesia Checklist: Patient identified, Emergency Drugs available, Suction available and Patient being monitored Patient Re-evaluated:Patient Re-evaluated prior to induction Oxygen Delivery Method: Circle system utilized Preoxygenation: Pre-oxygenation with 100% oxygen Induction Type: IV induction Ventilation: Mask ventilation without difficulty Laryngoscope Size: Mac and 4 Grade View: Grade I Tube type: Oral Tube size: 7.5 mm Number of attempts: 1 Airway Equipment and Method: Stylet and Oral airway Placement Confirmation: ETT inserted through vocal cords under direct vision,  positive ETCO2 and breath sounds checked- equal and bilateral Secured at: 21 cm Tube secured with: Tape Dental Injury: Teeth and Oropharynx as per pre-operative assessment

## 2020-04-19 NOTE — Brief Op Note (Signed)
04/19/2020  5:19 PM  PATIENT:  Alan Rivera  74 y.o. male  PRE-OPERATIVE DIAGNOSIS:  umbilical hernia  POST-OPERATIVE DIAGNOSIS:  umbilical hernia  PROCEDURE:  Procedure(s): ROBOTIC ASSISTED UMBILICAL HERNIA REPAIR WITH MESH (N/A)  SURGEON:  Surgeon(s) and Role:    * Greer Pickerel, MD - Primary    * White, Sharon Mt, MD - Assisting  PHYSICIAN ASSISTANT:   ASSISTANTS: Annye English MD   ANESTHESIA:   general  EBL:  Minimal    BLOOD ADMINISTERED:none  DRAINS: none   LOCAL MEDICATIONS USED:  MARCAINE     SPECIMEN:  No Specimen  DISPOSITION OF SPECIMEN:  N/A  COUNTS:  YES  TOURNIQUET:  * No tourniquets in log *  DICTATION: .Dragon Dictation  PLAN OF CARE: Discharge to home after PACU  PATIENT DISPOSITION:  PACU - hemodynamically stable.   Delay start of Pharmacological VTE agent (>24hrs) due to surgical blood loss or risk of bleeding: not applicable  Leighton Ruff. Redmond Pulling, MD, FACS General, Bariatric, & Minimally Invasive Surgery [facility], [place]

## 2020-04-19 NOTE — Discharge Instructions (Signed)
Alan Rivera, P.A. LAPAROSCOPIC SURGERY: POST OP INSTRUCTIONS Always review your discharge instruction sheet given to you by the facility where your surgery was performed. IF YOU HAVE DISABILITY OR FAMILY LEAVE FORMS, YOU MUST BRING THEM TO THE OFFICE FOR PROCESSING.   DO NOT GIVE THEM TO YOUR DOCTOR.  PAIN CONTROL  1. First take acetaminophen (Tylenol) AND/or ibuprofen (Advil) to control your pain after surgery.  Follow directions on package.  Taking acetaminophen (Tylenol) and/or ibuprofen (Advil) regularly after surgery will help to control your pain and lower the amount of prescription pain medication you may need.  You should not take more than 3,000 mg (3 grams) of acetaminophen (Tylenol) in 24 hours.  You should not take ibuprofen (Advil), aleve, motrin, naprosyn or other NSAIDS if you have a history of stomach ulcers or chronic kidney disease.  2. A prescription for pain medication may be given to you upon discharge.  Take your pain medication as prescribed, if you still have uncontrolled pain after taking acetaminophen (Tylenol) or ibuprofen (Advil). 3. Use ice packs to help control pain. 4. If you need a refill on your pain medication, please contact your pharmacy.  They will contact our office to request authorization. Prescriptions will not be filled after 5pm or on week-ends.  HOME MEDICATIONS 5. Take your usually prescribed medications unless otherwise directed.  DIET 6. You should follow a light diet the first few days after arrival home.  Be sure to include lots of fluids daily. Avoid fatty, fried foods.   CONSTIPATION 7. It is common to experience some constipation after surgery and if you are taking pain medication.  Increasing fluid intake and taking a stool softener (such as Colace) will usually help or prevent this problem from occurring.  A mild laxative (Milk of Magnesia or Miralax) should be taken according to package instructions if there are no bowel  movements after 48 hours.  WOUND/INCISION CARE 8. Most patients will experience some swelling and bruising in the area of the incisions.  Ice packs will help.  Swelling and bruising can take several days to resolve.  9. Unless discharge instructions indicate otherwise, follow guidelines below  a. STERI-STRIPS - you may remove your outer bandages 48 hours after surgery, and you may shower at that time.  You have steri-strips (small skin tapes) in place directly over the incision.  These strips should be left on the skin for 7-10 days.   b. DERMABOND/SKIN GLUE - you may shower in 24 hours.  The glue will flake off over the next 2-3 weeks. 10. Any sutures or staples will be removed at the office during your follow-up visit.  ACTIVITIES 11. You may resume regular (light) daily activities beginning the next day--such as daily self-care, walking, climbing stairs--gradually increasing activities as tolerated.  You may have sexual intercourse when it is comfortable.  Refrain from any heavy lifting or straining until approved by your doctor. a. You may drive when you are no longer taking prescription pain medication, you can comfortably wear a seatbelt, and you can safely maneuver your car and apply brakes.  FOLLOW-UP 12. You should see your doctor in the office for a follow-up appointment approximately 2-3 weeks after your surgery.  You should have been given your post-op/follow-up appointment when your surgery was scheduled.  If you did not receive a post-op/follow-up appointment, make sure that you call for this appointment within a day or two after you arrive home to insure a convenient appointment time.  OTHER  INSTRUCTIONS 13. DO NOT LIFT/PUSH/PULL ANYTHING GREATER THAN 15 LBS FOR 1 MONTH  WHEN TO CALL YOUR DOCTOR: 1. Fever over 101.0 2. Inability to urinate 3. Continued bleeding from incision. 4. Increased pain, redness, or drainage from the incision. 5. Increasing abdominal pain  The clinic  staff is available to answer your questions during regular business hours.  Please don't hesitate to call and ask to speak to one of the nurses for clinical concerns.  If you have a medical emergency, go to the nearest emergency room or call 911.  A surgeon from [facility] is always on call at the hospital. [address] [place], [place], [place]  [address] ? [address], [place], [place]   [address] [phone] ? [phone] ? FAX [phone] Web site: [email or web address]  ........Marland Kitchen   Managing Your Pain After Surgery Without Opioids    Thank you for participating in our program to help patients manage their pain after surgery without opioids. This is part of our effort to provide you with the best care possible, without exposing you or your family to the risk that opioids pose.  What pain can I expect after surgery? You can expect to have some pain after surgery. This is normal. The pain is typically worse the day after surgery, and quickly begins to get better. Many studies have found that many patients are able to manage their pain after surgery with Over-the-Counter (OTC) medications such as Tylenol and Motrin. If you have a condition that does not allow you to take Tylenol or Motrin, notify your surgical team.  How will I manage my pain? The best strategy for controlling your pain after surgery is around the clock pain control with Tylenol (acetaminophen) and Motrin (ibuprofen or Advil). Alternating these medications with each other allows you to maximize your pain control. In addition to Tylenol and Motrin, you can use heating pads or ice packs on your incisions to help reduce your pain.  How will I alternate your regular strength over-the-counter pain medication? You will take a dose of pain medication every three hours. ; Start by taking 650 mg of Tylenol (2 pills of 325 mg) ; 3 hours later take 600 mg of Motrin (3 pills of 200 mg) ; 3 hours  after taking the Motrin take 650 mg of Tylenol ; 3 hours after that take 600 mg of Motrin.   - 1 -  See example - if your first dose of Tylenol is at 12:00 PM   12:00 PM Tylenol 650 mg (2 pills of 325 mg)  3:00 PM Motrin 600 mg (3 pills of 200 mg)  6:00 PM Tylenol 650 mg (2 pills of 325 mg)  9:00 PM Motrin 600 mg (3 pills of 200 mg)  Continue alternating every 3 hours   We recommend that you follow this schedule around-the-clock for at least 3 days after surgery, or until you feel that it is no longer needed. Use the table on the last page of this handout to keep track of the medications you are taking. Important: Do not take more than 3000mg  of Tylenol or 1800mg  of Motrin in a 24-hour period. Do not take ibuprofen/Motrin if you have a history of bleeding stomach ulcers, severe kidney disease, &/or actively taking a blood thinner  What if I still have pain? If you have pain that is not controlled with the over-the-counter pain medications (Tylenol and Motrin or Advil) you might have what we call "breakthrough" pain. You will  receive a prescription for a small amount of an opioid pain medication such as Oxycodone, Tramadol, or Tylenol with Codeine. Use these opioid pills in the first 24 hours after surgery if you have breakthrough pain. Do not take more than 1 pill every 4-6 hours.  If you still have uncontrolled pain after using all opioid pills, don't hesitate to call our staff using the number provided. We will help make sure you are managing your pain in the best way possible, and if necessary, we can provide a prescription for additional pain medication.   Day 1    Time  Name of Medication Number of pills taken  Amount of Acetaminophen  Pain Level   Comments  AM PM       AM PM       AM PM       AM PM       AM PM       AM PM       AM PM       AM PM       Total Daily amount of Acetaminophen Do not take more than  3,000 mg per day      Day 2    Time  Name of  Medication Number of pills taken  Amount of Acetaminophen  Pain Level   Comments  AM PM       AM PM       AM PM       AM PM       AM PM       AM PM       AM PM       AM PM       Total Daily amount of Acetaminophen Do not take more than  3,000 mg per day      Day 3    Time  Name of Medication Number of pills taken  Amount of Acetaminophen  Pain Level   Comments  AM PM       AM PM       AM PM       AM PM          AM PM       AM PM       AM PM       AM PM       Total Daily amount of Acetaminophen Do not take more than  3,000 mg per day      Day 4    Time  Name of Medication Number of pills taken  Amount of Acetaminophen  Pain Level   Comments  AM PM       AM PM       AM PM       AM PM       AM PM       AM PM       AM PM       AM PM       Total Daily amount of Acetaminophen Do not take more than  3,000 mg per day      Day 5    Time  Name of Medication Number of pills taken  Amount of Acetaminophen  Pain Level   Comments  AM PM       AM PM       AM PM       AM PM       AM PM  AM PM       AM PM       AM PM       Total Daily amount of Acetaminophen Do not take more than  3,000 mg per day       Day 6    Time  Name of Medication Number of pills taken  Amount of Acetaminophen  Pain Level  Comments  AM PM       AM PM       AM PM       AM PM       AM PM       AM PM       AM PM       AM PM       Total Daily amount of Acetaminophen Do not take more than  3,000 mg per day      Day 7    Time  Name of Medication Number of pills taken  Amount of Acetaminophen  Pain Level   Comments  AM PM       AM PM       AM PM       AM PM       AM PM       AM PM       AM PM       AM PM       Total Daily amount of Acetaminophen Do not take more than  3,000 mg per day        For additional information about how and where to safely dispose of unused opioid medications - [employer]  Disclaimer: This  document contains information and/or instructional materials adapted from [place] for the typical patient with your condition. It does not replace medical advice from your health care provider because your experience may differ from that of the typical patient. Talk to your health care provider if you have any questions about this document, your condition or your treatment plan. Adapted from [place]

## 2020-06-25 ENCOUNTER — Other Ambulatory Visit: Payer: Self-pay | Admitting: Family Medicine

## 2020-06-25 DIAGNOSIS — M810 Age-related osteoporosis without current pathological fracture: Secondary | ICD-10-CM

## 2020-09-29 ENCOUNTER — Ambulatory Visit
Admission: RE | Admit: 2020-09-29 | Discharge: 2020-09-29 | Disposition: A | Discharge status: Home / Self Care | Payer: Medicare Other | Source: Ambulatory Visit | Attending: Family Medicine | Admitting: Family Medicine

## 2020-09-29 ENCOUNTER — Other Ambulatory Visit: Payer: Self-pay

## 2020-09-29 DIAGNOSIS — M810 Age-related osteoporosis without current pathological fracture: Secondary | ICD-10-CM

## 2021-03-04 ENCOUNTER — Other Ambulatory Visit: Payer: Self-pay | Admitting: Urology

## 2021-03-04 DIAGNOSIS — D49512 Neoplasm of unspecified behavior of left kidney: Secondary | ICD-10-CM

## 2021-03-15 ENCOUNTER — Ambulatory Visit (HOSPITAL_COMMUNITY): Payer: Medicare Other

## 2021-03-18 ENCOUNTER — Ambulatory Visit (HOSPITAL_COMMUNITY): Payer: Medicare Other

## 2021-03-21 ENCOUNTER — Ambulatory Visit (HOSPITAL_COMMUNITY)
Admission: RE | Admit: 2021-03-21 | Discharge: 2021-03-21 | Disposition: A | Discharge status: Home / Self Care | Payer: Medicare Other | Source: Ambulatory Visit | Attending: Urology | Admitting: Urology

## 2021-03-21 ENCOUNTER — Other Ambulatory Visit: Payer: Self-pay

## 2021-03-21 DIAGNOSIS — D49512 Neoplasm of unspecified behavior of left kidney: Secondary | ICD-10-CM | POA: Insufficient documentation

## 2021-03-21 MED ORDER — GADOBUTROL 1 MMOL/ML IV SOLN
8.0000 mL | Freq: Once | INTRAVENOUS | Status: AC | PRN
  Administered 2021-03-21: 8 mL via INTRAVENOUS

## 2021-03-31 ENCOUNTER — Other Ambulatory Visit (HOSPITAL_COMMUNITY): Payer: Self-pay | Admitting: Family Medicine

## 2021-03-31 DIAGNOSIS — R911 Solitary pulmonary nodule: Secondary | ICD-10-CM

## 2021-04-05 ENCOUNTER — Ambulatory Visit (HOSPITAL_COMMUNITY)
Admission: RE | Admit: 2021-04-05 | Discharge: 2021-04-05 | Disposition: A | Discharge status: Home / Self Care | Payer: Medicare Other | Source: Ambulatory Visit | Attending: Family Medicine | Admitting: Family Medicine

## 2021-04-05 ENCOUNTER — Other Ambulatory Visit: Payer: Self-pay

## 2021-04-05 DIAGNOSIS — R911 Solitary pulmonary nodule: Secondary | ICD-10-CM | POA: Insufficient documentation

## 2021-04-18 ENCOUNTER — Other Ambulatory Visit (HOSPITAL_COMMUNITY): Payer: Self-pay | Admitting: Family Medicine

## 2021-04-18 ENCOUNTER — Other Ambulatory Visit: Payer: Self-pay | Admitting: Family Medicine

## 2021-04-18 DIAGNOSIS — R911 Solitary pulmonary nodule: Secondary | ICD-10-CM

## 2021-05-04 ENCOUNTER — Ambulatory Visit (HOSPITAL_COMMUNITY)
Admission: RE | Admit: 2021-05-04 | Discharge: 2021-05-04 | Disposition: A | Discharge status: Home / Self Care | Payer: Medicare Other | Source: Ambulatory Visit | Attending: Family Medicine | Admitting: Family Medicine

## 2021-05-04 ENCOUNTER — Other Ambulatory Visit: Payer: Self-pay

## 2021-05-04 DIAGNOSIS — R911 Solitary pulmonary nodule: Secondary | ICD-10-CM | POA: Diagnosis present

## 2021-05-04 LAB — GLUCOSE, CAPILLARY: Glucose-Capillary: 104 mg/dL — ABNORMAL HIGH (ref 70–99)

## 2021-05-04 MED ORDER — FLUDEOXYGLUCOSE F - 18 (FDG) INJECTION
8.7000 | Freq: Once | INTRAVENOUS | Status: AC
  Administered 2021-05-04: 8.5 via INTRAVENOUS

## 2021-06-06 ENCOUNTER — Telehealth: Payer: Self-pay | Admitting: Oncology

## 2021-06-06 NOTE — Telephone Encounter (Signed)
Mr. Alan Rivera has been referred for management of left lung nodule, PET scan was not conclusive by Jillyn Ledger, FNP at [facility]. Mr. Alan Rivera has been scheduled to see Dr. Alen Blew on 8/16 at 2pm. Aware to arrive 15 minutes early.

## 2021-06-21 ENCOUNTER — Other Ambulatory Visit: Payer: Self-pay

## 2021-06-21 ENCOUNTER — Inpatient Hospital Stay: Payer: Medicare Other | Attending: Oncology | Admitting: Oncology

## 2021-06-21 VITALS — BP 107/66 | HR 78 | Temp 97.7°F | Resp 18 | Ht 67.0 in | Wt 184.1 lb

## 2021-06-21 DIAGNOSIS — Z87891 Personal history of nicotine dependence: Secondary | ICD-10-CM | POA: Diagnosis not present

## 2021-06-21 DIAGNOSIS — C61 Malignant neoplasm of prostate: Secondary | ICD-10-CM | POA: Insufficient documentation

## 2021-06-21 DIAGNOSIS — R911 Solitary pulmonary nodule: Secondary | ICD-10-CM | POA: Insufficient documentation

## 2021-06-21 DIAGNOSIS — Z8042 Family history of malignant neoplasm of prostate: Secondary | ICD-10-CM | POA: Insufficient documentation

## 2021-06-21 DIAGNOSIS — Z923 Personal history of irradiation: Secondary | ICD-10-CM | POA: Diagnosis not present

## 2021-06-21 DIAGNOSIS — Z801 Family history of malignant neoplasm of trachea, bronchus and lung: Secondary | ICD-10-CM | POA: Diagnosis not present

## 2021-06-21 NOTE — Progress Notes (Signed)
Reason for the request:    Lung nodule  HPI: I was asked by Jillyn Ledger FNP to evaluate Mr. Klym for the evaluation of lung nodule.  He is a 75 year old man with history of prostate cancer diagnosed in 2018.  At that time he presented with a Gleason score 4+5 = 9 and PSA of 6.6.  He underwent definitive therapy with radiation and completed a total of 75 Gray in May 2018 as well as androgen deprivation..  He has no evidence of relapsed disease at this time.  He is currently on active surveillance with PSA remains very low and following with Dr. Alinda Money.  He underwent MRI of the abdomen for evaluation for a left kidney mass.  The left kidney showed a Bosniak category 2 cyst without any evidence of malignancy however there is an 8 mm right lower lung nodule was noted.  Based on that scan he underwent a CT scan of the chest without contrast on Apr 05, 2020.  The CT scan showed a 13 mm left upper lobe nodule with irregular margins that are suspicious for malignancy.  PET scan obtained on May 04, 2021 showed the left upper lobe nodule with irregular contour with SUV uptake of 1.6.  No evidence of adenopathy or any other malignancy.  He denies any respiratory complaints at this time.  Denies any shortness of breath or difficulty breathing.  He denies any cough or wheezing.    He does not report any headaches, blurry vision, syncope or seizures. Does not report any fevers, chills or sweats.  Does not report any cough, wheezing or hemoptysis.  Does not report any chest pain, palpitation, orthopnea or leg edema.  Does not report any nausea, vomiting or abdominal pain.  Does not report any constipation or diarrhea.  Does not report any skeletal complaints.    Does not report frequency, urgency or hematuria.  Does not report any skin rashes or lesions. Does not report any heat or cold intolerance.  Does not report any lymphadenopathy or petechiae.  Does not report any anxiety or depression.  Remaining review of  systems is negative.     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--  currently bilateral diffuse weakness upper and lower extremities   Post-polio syndrome    bilateral diffuse weakness upper and lower extremities, ambulates 50-70 yards   Prostate cancer ([place]) UROLOGIST-  [name]/  ONCOLOGIST -- [name]   dx 10/ 2017-- Stage T2a,  Gleason 4+5,  PSA 6.68, vol [id number]   Umbilical hernia    Uses walker    prn   Wears glasses   :   Past Surgical History:  Procedure Laterality Date   FOOT SURGERY Bilateral child   l related to effects of polio   PROSTATE BIOPSY     PROSTATE BIOPSY N/A 01/05/2017   Procedure: PROSTATE ULTRASOUND WITH SPACE OAR;  Surgeon: [name], MD;  Location: [facility];  Service: Urology;  Laterality: N/A;   SHOULDER ARTHROSCOPY WITH  OPEN ROTATOR CUFF REPAIR Bilateral right 04/30/2007;  left 10-15-2007   w/ Debridement/  DCR/  SAD/  Acromioplasty/  Bursectomy   TONSILLECTOMY AND ADENOIDECTOMY    :   Current Outpatient Medications:    alendronate (FOSAMAX) 70 MG tablet, Take 70 mg by mouth once a week. Take with a full glass of water on an empty stomach., Disp: , Rfl:    EPINEPHrine (EPIPEN JR 2-PAK) 0.15 MG/0.3ML injection, Inject 0.15 mg into the muscle as needed for anaphylaxis., Disp: , Rfl:    lisinopril (PRINIVIL,ZESTRIL) 10 MG tablet, Take 10 mg by mouth daily., Disp: , Rfl:    Omega 3-6-9 Fatty Acids (OMEGA-3-6-9 PO), Take 750 mg by mouth in the morning and at bedtime., Disp: , Rfl:    oxyCODONE (OXY IR/ROXICODONE) 5 MG immediate  release tablet, Take 1 tablet (5 mg total) by mouth every 6 (six) hours as needed for severe pain., Disp: 12 tablet, Rfl: 0:   Allergies  Allergen Reactions   Shellfish Allergy Anaphylaxis  :   Family History  Problem Relation Age of Onset   Cancer Father        lung and prostate cancer   Heart failure Mother        AFIB   Heart disease Mother        AFIB  :   Social History   Socioeconomic History   Marital status: Married    Spouse name: Not on file   Number of children: Not on file   Years of education: Not on file   Highest education level: Not on file  Occupational History   Not on file  Tobacco Use   Smoking status: Former    Packs/day: 3.00    Years: 12.00    Pack years: 36.00    Types: Cigarettes    Quit date: 06/06/1981    Years since quitting: 40.0   Smokeless tobacco: Never  Vaping Use   Vaping Use: Never used  Substance and Sexual Activity   Alcohol use: Yes    Alcohol/week: 4.0 standard drinks    Types: 4 Glasses of wine per week   Drug use: No   Sexual activity: Yes    Comment: vasectomy  Other Topics Concern   Not on file  Social History Narrative   Not on file   Social Determinants of Health   Financial Resource Strain: Not on file  Food Insecurity: Not on file  Transportation Needs: Not on file  Physical Activity: Not on file  Stress: Not on file  Social Connections: Not on file  Intimate Partner Violence: Not on file  :  Pertinent items are noted in HPI.  Exam: Blood pressure 107/66, pulse 78, temperature 97.7 F (36.5 C), temperature source Oral, resp. rate 18, height '5\' 7"'$  (1.702 m), weight 184 lb 1.6 oz (83.5 kg), SpO2 98 %. ECOG 1 General appearance: alert and cooperative appeared without distress. Head: atraumatic without any abnormalities. Eyes: conjunctivae/corneas clear. PERRL.  Sclera anicteric. Throat: lips, mucosa, and tongue normal; without oral thrush or ulcers. Resp: clear to auscultation bilaterally without  rhonchi, wheezes or dullness to percussion. Cardio: regular rate and rhythm, S1, S2 normal, no murmur, click, rub or gallop GI: soft, non-tender; bowel sounds normal; no masses,  no organomegaly Skin: Skin color, texture, turgor normal. No rashes or lesions Lymph nodes: Cervical, supraclavicular, and axillary nodes normal. Neurologic: Grossly normal without any motor, sensory or deep tendon reflexes. Musculoskeletal: No joint deformity or effusion.   Assessment and  Plan:    75 year old with:  1.  Left upper lobe lung nodule noted incidentally initially in May 2022.  PET scan obtained on May 04, 2021 showed a 13 mm with low SUV uptake.  The differential diagnosis of this finding were discussed at this time.  Benign etiology remains favored at this time given the lack of change and overall low SUV uptake.  Low-grade neoplasm including primary lung malignancy could not be completely ruled out at this time.  Metastatic prostate cancer is extremely unlikely at this point.  Primary management options, obtaining tissue biopsy now or repeat imaging studies in the next few months to monitor for growth are all considerations.  Pulmonary medicine evaluation and possible biopsy would be recommended especially if there is some growth noted in subsequent scans.  After discussion today, we opted to repeat imaging studies in the next few months and follow-up at that time.  Pending these results we will make the appropriate management choices including pulmonary referral based on his wishes.   2.  Prostate cancer: He was diagnosed in 2018 with Gleason score 9 and a PSA of 6.6.  He received definitive therapy with radiation without any evidence of relapsed disease.  I do not think prostate cancer has any impact or correlation with his lung nodule.  He continues to follow with Dr. Alinda Money regarding this issue.   3.  Follow-up: We will be in December after repeat imaging studies.   45  minutes were  dedicated to this visit. The time was spent on reviewing  imaging studies, discussing treatment options, discussing differential diagnosis and answering questions regarding future plan.    A copy of this consult has been forwarded to the requesting provider

## 2021-08-25 ENCOUNTER — Other Ambulatory Visit: Payer: Self-pay | Admitting: Oncology

## 2021-08-25 DIAGNOSIS — C61 Malignant neoplasm of prostate: Secondary | ICD-10-CM

## 2021-09-14 ENCOUNTER — Ambulatory Visit: Payer: Medicare Other | Attending: Internal Medicine

## 2021-09-14 ENCOUNTER — Other Ambulatory Visit (HOSPITAL_BASED_OUTPATIENT_CLINIC_OR_DEPARTMENT_OTHER): Payer: Self-pay

## 2021-09-14 DIAGNOSIS — Z23 Encounter for immunization: Secondary | ICD-10-CM

## 2021-09-14 MED ORDER — PFIZER COVID-19 VAC BIVALENT 30 MCG/0.3ML IM SUSP
INTRAMUSCULAR | 0 refills | Status: AC
  Filled 2021-09-14: qty 0.3, 1d supply, fill #0

## 2021-09-14 NOTE — Progress Notes (Signed)
   Covid-19 Vaccination Clinic  Name:  Alan Rivera    MRN: [id number] DOB: 1946-08-24  09/14/2021  Mr. Alan Rivera was observed post Covid-19 immunization for 15 minutes without incident. He was provided with Vaccine Information Sheet and instruction to access the V-Safe system.   Mr. Alan Rivera was instructed to call 911 with any severe reactions post vaccine: Difficulty breathing  Swelling of face and throat  A fast heartbeat  A bad rash all over body  Dizziness and weakness   Immunizations Administered     Name Date Dose VIS Date Route   Pfizer Covid-19 Vaccine Bivalent Booster 09/14/2021  1:31 PM 0.3 mL 07/06/2021 Intramuscular   Manufacturer: [place]   Lot: [id number]   [place]: [phone]

## 2021-10-11 ENCOUNTER — Ambulatory Visit (HOSPITAL_COMMUNITY)
Admission: RE | Admit: 2021-10-11 | Discharge: 2021-10-11 | Disposition: A | Discharge status: Home / Self Care | Payer: Medicare Other | Source: Ambulatory Visit | Attending: Oncology | Admitting: Oncology

## 2021-10-11 ENCOUNTER — Other Ambulatory Visit: Payer: Self-pay

## 2021-10-11 DIAGNOSIS — C61 Malignant neoplasm of prostate: Secondary | ICD-10-CM | POA: Diagnosis present

## 2021-10-11 LAB — POCT I-STAT CREATININE: Creatinine, Ser: 0.8 mg/dL (ref 0.61–1.24)

## 2021-10-11 MED ORDER — IOHEXOL 350 MG/ML SOLN
80.0000 mL | Freq: Once | INTRAVENOUS | Status: AC | PRN
  Administered 2021-10-11: 80 mL via INTRAVENOUS

## 2021-10-18 ENCOUNTER — Other Ambulatory Visit: Payer: Self-pay

## 2021-10-18 ENCOUNTER — Inpatient Hospital Stay: Payer: Medicare Other | Attending: Oncology | Admitting: Oncology

## 2021-10-18 VITALS — BP 138/79 | HR 69 | Temp 97.9°F | Resp 17 | Ht 67.0 in | Wt 184.5 lb

## 2021-10-18 DIAGNOSIS — C61 Malignant neoplasm of prostate: Secondary | ICD-10-CM | POA: Diagnosis present

## 2021-10-18 DIAGNOSIS — R911 Solitary pulmonary nodule: Secondary | ICD-10-CM | POA: Insufficient documentation

## 2021-10-18 DIAGNOSIS — Z79899 Other long term (current) drug therapy: Secondary | ICD-10-CM | POA: Diagnosis not present

## 2021-10-18 NOTE — Progress Notes (Signed)
Hematology and Oncology Follow Up Visit  Alan Rivera [id number] Nov 29, 1945 75 y.o. 10/18/2021 2:53 PM Cari Caraway, MDBrake, Alan Lathe, FNP   Principle Diagnosis: 75 year old with a left upper lobe lung nodule diagnosed in May 2022.  Evaluation revealed a 13 mm nodule with low SUV uptake.   Prior Therapy:  He is status post definitive therapy with radiation for Gleason score 9 prostate cancer in 2018.  Current therapy: Active surveillance  Interim History: Alan Rivera returns today for a follow-up visit.  Since the last visit, he reports no major changes in his health.  He denies any chest pain, shortness of breath or difficulty breathing.  He denies any hospitalizations or illnesses.  He denies any cough wheezing or hemoptysis.  His performance status quality of life remained excellent.     Medications: I have reviewed the patient's current medications.  Current Outpatient Medications  Medication Sig Dispense Refill   alendronate (FOSAMAX) 70 MG tablet Take 70 mg by mouth once a week. Take with a full glass of water on an empty stomach.     COVID-19 mRNA bivalent vaccine, Pfizer, (PFIZER COVID-19 VAC BIVALENT) injection Inject into the muscle. 0.3 mL 0   EPINEPHrine (EPIPEN JR 2-PAK) 0.15 MG/0.3ML injection Inject 0.15 mg into the muscle as needed for anaphylaxis.     lisinopril (PRINIVIL,ZESTRIL) 10 MG tablet Take 10 mg by mouth daily.     Omega 3-6-9 Fatty Acids (OMEGA-3-6-9 PO) Take 750 mg by mouth in the morning and at bedtime.     oxyCODONE (OXY IR/ROXICODONE) 5 MG immediate release tablet Take 1 tablet (5 mg total) by mouth every 6 (six) hours as needed for severe pain. 12 tablet 0   No current facility-administered medications for this visit.     Allergies:  Allergies  Allergen Reactions   Shellfish Allergy Anaphylaxis      Physical Exam: Blood pressure 138/79, pulse 69, temperature 97.9 F (36.6 C), temperature source Temporal, resp. rate 17, height 5\' 7"   (1.702 m), weight 184 lb 8 oz (83.7 kg), SpO2 100 %.  ECOG: 1   General appearance: Comfortable appearing without any discomfort Head: Normocephalic without any trauma Oropharynx: Mucous membranes are moist and pink without any thrush or ulcers. Eyes: Pupils are equal and round reactive to light. Lymph nodes: No cervical, supraclavicular, inguinal or axillary lymphadenopathy.   Heart:regular rate and rhythm.  S1 and S2 without leg edema. Lung: Clear without any rhonchi or wheezes.  No dullness to percussion. Abdomin: Soft, nontender, nondistended with good bowel sounds.  No hepatosplenomegaly. Musculoskeletal: No joint deformity or effusion.  Full range of motion noted. Neurological: No deficits noted on motor, sensory and deep tendon reflex exam. Skin: No petechial rash or dryness.  Appeared moist.      Lab Results: Lab Results  Component Value Date   WBC 5.6 04/15/2020   HGB 13.5 04/15/2020   HCT 40.8 04/15/2020   MCV 93.2 04/15/2020   PLT 176 04/15/2020     Chemistry      Component Value Date/Time   NA 141 04/15/2020 1525   K 3.9 04/15/2020 1525   CL 111 04/15/2020 1525   CO2 25 04/15/2020 1525   BUN 21 04/15/2020 1525   CREATININE 0.80 10/11/2021 1131      Component Value Date/Time   CALCIUM 8.6 (L) 04/15/2020 1525   ALKPHOS 53 04/15/2020 1525   AST 20 04/15/2020 1525   ALT 17 04/15/2020 1525   BILITOT 0.6 04/15/2020 1525  EXAM: CT CHEST WITHOUT CONTRAST   CT ABDOMEN AND PELVIS WITH AND WITHOUT CONTRAST   TECHNIQUE: Multidetector CT imaging of the chest was performed without intravenous contrast administration.   Multidetector CT imaging of the abdomen and pelvis was performed following the standard protocol before and during bolus administration of intravenous contrast.   CONTRAST:  83mL OMNIPAQUE IOHEXOL 350 MG/ML SOLN   COMPARISON:  PET-CT, 05/04/2021, CT chest, 04/05/2021, MR abdomen, 03/21/2021   FINDINGS: CT CHEST FINDINGS    Cardiovascular: Aortic atherosclerosis. Normal heart size. Left coronary artery calcifications. No pericardial effusion.   Mediastinum/Nodes: No enlarged mediastinal, hilar, or axillary lymph nodes. Thyroid gland, trachea, and esophagus demonstrate no significant findings.   Lungs/Pleura: Bandlike scarring of the bilateral lung bases. Unchanged lobulated nodule of the anterior left upper lobe measuring 1.0 x 0.7 cm (series 5, image 71). No pleural effusion or pneumothorax.   Musculoskeletal: No chest wall mass or suspicious bone lesions identified.   CT ABDOMEN PELVIS FINDINGS   Hepatobiliary: No solid liver abnormality is seen. No gallstones, gallbladder wall thickening, or biliary dilatation.   Pancreas: Unremarkable. No pancreatic ductal dilatation or surrounding inflammatory changes.   Spleen: Normal in size without significant abnormality.   Adrenals/Urinary Tract: Adrenal glands are unremarkable. There are multiple bilateral renal cysts of varying sizes, all of which large enough to confidently characterize are of simple fluid attenuation, without evidence of suspicious solid component or contrast enhancement. In particular, there is a simple cyst of the medial superior pole of the left kidney without appreciable associated solid component or contrast enhancement. There is a very large, exophytic cyst of the anterior midportion of the right kidney which measures at least 14.6 cm. Bladder is unremarkable.   Stomach/Bowel: Stomach is within normal limits. Appendix appears normal. No evidence of bowel wall thickening, distention, or inflammatory changes. Descending and sigmoid diverticulosis.   Vascular/Lymphatic: Aortic atherosclerosis. No enlarged abdominal or pelvic lymph nodes.   Reproductive: Marking clips in the prostate.   Other: No abdominal wall hernia or abnormality. No abdominopelvic ascites.   Musculoskeletal: No acute or significant osseous findings.    IMPRESSION: 1. Unchanged lobulated nodule of the anterior left upper lobe measuring 1.0 x 0.7 cm. This is nonspecific, and previously with low level FDG avidity, but remains morphologically suspicious for primary lung malignancy. Recommend additional follow-up in 6-12 months to ensure stability. 2. There are multiple bilateral renal cysts of varying sizes, all of which large enough to confidently characterize are of simple fluid attenuation, without evidence of suspicious solid component or contrast enhancement. In particular, there is a simple cyst of the medial superior pole of the left kidney without appreciable associated solid component or contrast enhancement. Findings are consistent with benign simple cysts. No further for follow-up or characterization is required. 3. There is a very large, exophytic cyst of the anterior midportion of the right kidney which measures at least 14.6 cm, although benign, may be symptomatic due to large size and mass effect. 4. Coronary artery disease.   Aortic Atherosclerosis (ICD10-I70.0).     Impression and Plan:  75 year old with:  1.  Pulmonary nodule noted in May 2022.  He was found to have a 13 mm nodule with low SUV uptake.    CT scan obtained on October 11, 2021 was personally reviewed and showed unchanged lobulated nodule in the anterior left upper lobe which is nonspecific.  The differential diagnosis including primary lung neoplasm versus benign etiology were discussed.  At this time given the location  and the size it would be difficult to obtain tissue biopsy and I recommended active surveillance.  We will repeat imaging studies in 6 months to ensure stability.  He is agreeable with this plan.     2.  Prostate cancer: No evidence of relapse noted at this time.  He is status post definitive treatment.     3.  Follow-up: In 6 months for repeat follow-up.     30  minutes were spent on this encounter.  The time was dedicated to  reviewing imaging studies, differential diagnosis discussion and outlining future plan of care.          Zola Button, MD 12/13/20222:53 PM

## 2022-02-08 DIAGNOSIS — H353122 Nonexudative age-related macular degeneration, left eye, intermediate dry stage: Secondary | ICD-10-CM | POA: Diagnosis not present

## 2022-02-08 DIAGNOSIS — H35711 Central serous chorioretinopathy, right eye: Secondary | ICD-10-CM | POA: Diagnosis not present

## 2022-02-22 DIAGNOSIS — M810 Age-related osteoporosis without current pathological fracture: Secondary | ICD-10-CM | POA: Diagnosis not present

## 2022-02-22 DIAGNOSIS — I10 Essential (primary) hypertension: Secondary | ICD-10-CM | POA: Diagnosis not present

## 2022-04-14 DIAGNOSIS — D49512 Neoplasm of unspecified behavior of left kidney: Secondary | ICD-10-CM | POA: Diagnosis not present

## 2022-04-17 ENCOUNTER — Ambulatory Visit (HOSPITAL_COMMUNITY)
Admission: RE | Admit: 2022-04-17 | Discharge: 2022-04-17 | Disposition: A | Discharge status: Home / Self Care | Payer: Medicare Other | Source: Ambulatory Visit | Attending: Oncology | Admitting: Oncology

## 2022-04-17 DIAGNOSIS — R911 Solitary pulmonary nodule: Secondary | ICD-10-CM | POA: Diagnosis not present

## 2022-04-18 ENCOUNTER — Other Ambulatory Visit: Payer: Self-pay

## 2022-04-18 ENCOUNTER — Inpatient Hospital Stay: Payer: Medicare Other | Attending: Oncology | Admitting: Oncology

## 2022-04-18 VITALS — BP 121/78 | HR 72 | Temp 97.2°F | Resp 15 | Wt 187.1 lb

## 2022-04-18 DIAGNOSIS — Z79899 Other long term (current) drug therapy: Secondary | ICD-10-CM | POA: Insufficient documentation

## 2022-04-18 DIAGNOSIS — Z8546 Personal history of malignant neoplasm of prostate: Secondary | ICD-10-CM | POA: Insufficient documentation

## 2022-04-18 DIAGNOSIS — R911 Solitary pulmonary nodule: Secondary | ICD-10-CM | POA: Diagnosis not present

## 2022-04-18 NOTE — Progress Notes (Signed)
Hematology and Oncology Follow Up Visit  Alan Rivera [id number] 12/01/1945 76 y.o. 04/18/2022 3:02 PM Cari Caraway, MDMcNeill, Abigail Butts, MD   Principle Diagnosis: 76 year old man with a left upper lobe lung nodule measuring 13 mm diagnosed in May 2022.  Differential diagnosis of low-grade malignancy versus benign findings were reviewed.   Prior Therapy:  He is status post definitive therapy with radiation for Gleason score 9 prostate cancer in 2018.  Current therapy: Active surveillance  Interim History: Alan Rivera is here for repeat evaluation.  Since last visit, he reports feeling well without any recent complaints.  He denies any recent hospitalizations or illnesses.  He denies any shortness of breath or difficulty breathing.  He denies any cough or wheezing.     Medications: Updated on review. Current Outpatient Medications  Medication Sig Dispense Refill   alendronate (FOSAMAX) 70 MG tablet Take 70 mg by mouth once a week. Take with a full glass of water on an empty stomach.     atorvastatin (LIPITOR) 10 MG tablet Take 10 mg by mouth daily.     COVID-19 mRNA bivalent vaccine, Pfizer, (PFIZER COVID-19 VAC BIVALENT) injection Inject into the muscle. 0.3 mL 0   EPINEPHrine (EPIPEN JR) 0.15 MG/0.3ML injection Inject 0.15 mg into the muscle as needed for anaphylaxis.     lisinopril (PRINIVIL,ZESTRIL) 10 MG tablet Take 10 mg by mouth daily.     Omega 3-6-9 Fatty Acids (OMEGA-3-6-9 PO) Take 750 mg by mouth in the morning and at bedtime.     oxyCODONE (OXY IR/ROXICODONE) 5 MG immediate release tablet Take 1 tablet (5 mg total) by mouth every 6 (six) hours as needed for severe pain. 12 tablet 0   No current facility-administered medications for this visit.     Allergies:  Allergies  Allergen Reactions   Shellfish Allergy Anaphylaxis      Physical Exam:  Blood pressure 121/78, pulse 72, temperature (!) 97.2 F (36.2 C), temperature source Temporal, resp. rate 15, weight  187 lb 1.6 oz (84.9 kg), SpO2 96 %.  ECOG: 1   General appearance: Alert, awake without any distress. Head: Atraumatic without abnormalities Oropharynx: Without any thrush or ulcers. Eyes: No scleral icterus. Lymph nodes: No lymphadenopathy noted in the cervical, supraclavicular, or axillary nodes Heart:regular rate and rhythm, without any murmurs or gallops.   Lung: Clear to auscultation without any rhonchi, wheezes or dullness to percussion. Abdomin: Soft, nontender without any shifting dullness or ascites. Musculoskeletal: No clubbing or cyanosis. Neurological: No motor or sensory deficits. Skin: No rashes or lesions.      Lab Results: Lab Results  Component Value Date   WBC 5.6 04/15/2020   HGB 13.5 04/15/2020   HCT 40.8 04/15/2020   MCV 93.2 04/15/2020   PLT 176 04/15/2020     Chemistry      Component Value Date/Time   NA 141 04/15/2020 1525   K 3.9 04/15/2020 1525   CL 111 04/15/2020 1525   CO2 25 04/15/2020 1525   BUN 21 04/15/2020 1525   CREATININE 0.80 10/11/2021 1131      Component Value Date/Time   CALCIUM 8.6 (L) 04/15/2020 1525   ALKPHOS 53 04/15/2020 1525   AST 20 04/15/2020 1525   ALT 17 04/15/2020 1525   BILITOT 0.6 04/15/2020 1525           Impression and Plan:  76 year old with:  1.  Left upper lobe 13 pulmonary nodule noted in May 2022.    He is currently on active surveillance given  the chronicity of this finding as well as low FDG uptake and appears to be less malignant in nature.  CT scan obtained on 04/17/2022 was reviewed although the final report is not available at this time.  If this Continues to show stable size of this nodule no additional follow-up will be needed.  If any growth noted then surgical resection versus biopsy will be considered.     2.  Prostate cancer: He continues to be without any evidence of relapsed disease.     3.  Follow-up: Depending on the results of the CT scan possibly repeat imaging in 6 months  versus discontinuing any follow-up.     20  minutes were dedicated to this visit.  The time spent on reviewing imaging studies, differential diagnosis discussion and management choices for the future.          Zola Button, MD 6/13/20233:02 PM

## 2022-04-19 ENCOUNTER — Telehealth: Payer: Self-pay | Admitting: *Deleted

## 2022-04-19 NOTE — Telephone Encounter (Signed)
LM with note below 

## 2022-04-19 NOTE — Telephone Encounter (Signed)
-----   Message from Wyatt Portela, MD sent at 04/19/2022  9:49 AM EDT ----- Please let him know his scan looks good. The lung nodule has not changed. No concerting for cancer

## 2022-04-28 ENCOUNTER — Telehealth: Payer: Self-pay | Admitting: Oncology

## 2022-04-28 NOTE — Telephone Encounter (Signed)
.  Called patient to schedule appointment per 6/14 inbasket, patient is aware of date and time.

## 2022-05-28 IMAGING — MR MR ABDOMEN WO/W CM
18 series · 48 of 48 positions shown · IV contrast (gadavist)
Comparison: Ultrasound evaluation of 10/15/2020.

CLINICAL DATA: Further workup of renal lesion arising from the LEFT
kidney.

EXAM:
MRI ABDOMEN WITHOUT AND WITH CONTRAST
TECHNIQUE: Multiplanar multisequence MR imaging of the abdomen was performed
both before and after the administration of intravenous contrast.
CONTRAST:  8mL GADAVIST GADOBUTROL 1 MMOL/ML IV SOLN

[Series 3: T2 · coronal · 6.0mm · 1.56mm/px · 2 of 37 slices shown (1 of 2)]
[im 1/37]
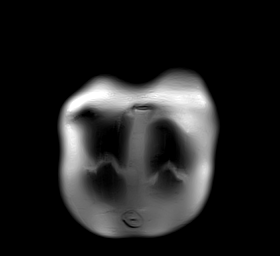
[im 37/37]
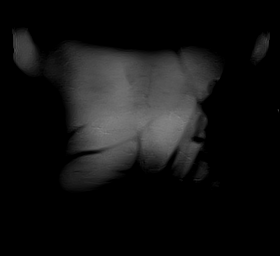

[Series 5: T2 fat-sat · axial · 6.0mm · 1.25mm/px · z∈[-175,+98]mm · 2 of 39 slices shown]
[im 1/39]
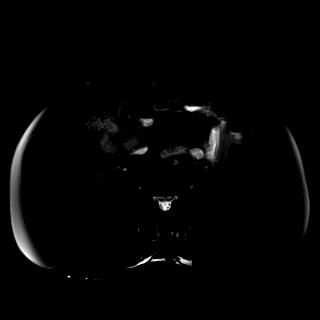
[im 39/39]
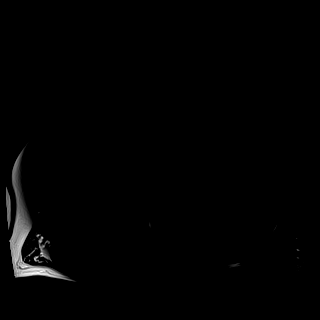

[Series 6: DWI · axial · 6.0mm · 1.49mm/px · z∈[-209,+79]mm · 4 of 82 slices shown (1 of 2)]
[im 1/82]
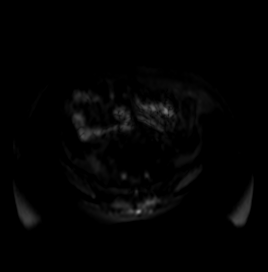
[im 28/82]
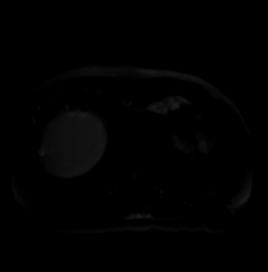
[im 55/82]
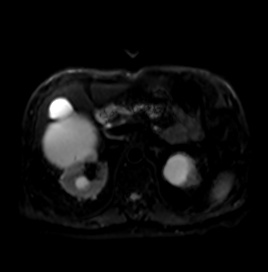
[im 82/82]
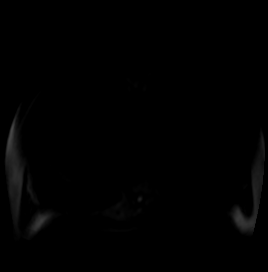

[Series 7: DWI · axial · 6.0mm · 1.49mm/px · 1 of 41 slices shown (2 of 2)]
[im 1/41]
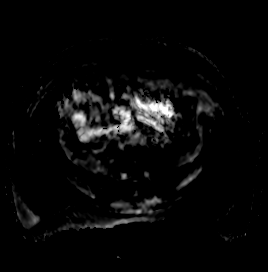

[Series 8: T1 · axial · 3.0mm · 1.25mm/px · z∈[-203,+58]mm · 3 of 88 slices shown (1 of 2)]
[im 1/88]
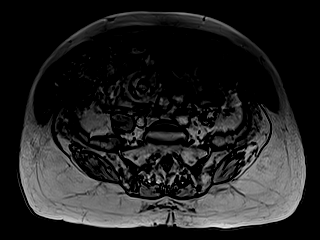
[im 44/88]
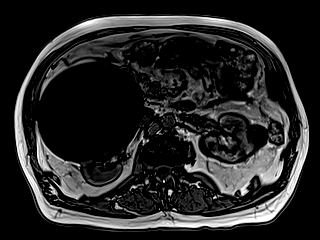
[im 88/88]
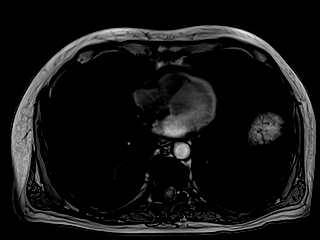

[Series 9: T1 · axial · 3.0mm · 1.25mm/px · z∈[-203,+58]mm · 3 of 88 slices shown (2 of 2)]
[im 1/88]
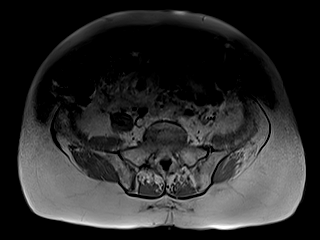
[im 44/88]
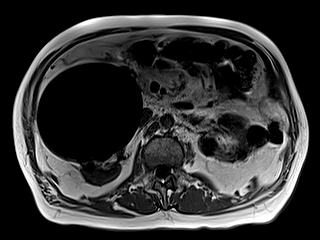
[im 88/88]
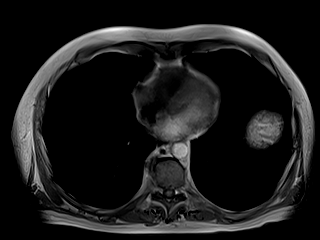

[Series 10: bSSFP · axial · 5.0mm · 0.84mm/px · z∈[-213,+67]mm · 2 of 52 slices shown]
[im 1/52]
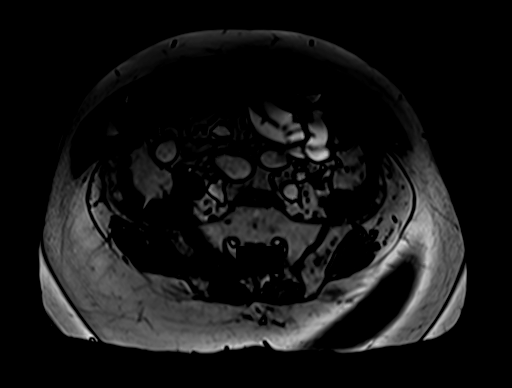
[im 52/52]
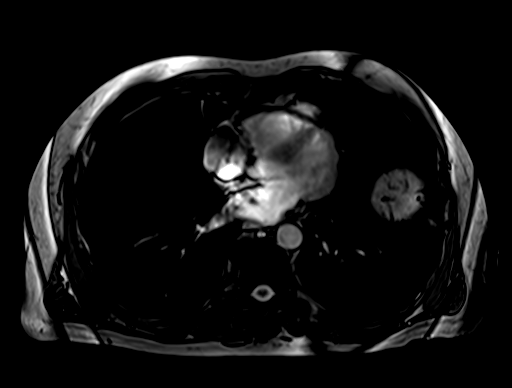

[Series 12: T1 dynamic · axial · 3.0mm · 1.25mm/px · z∈[-192,+69]mm · 3 of 88 slices shown (1 of 10)]
[im 1/88]
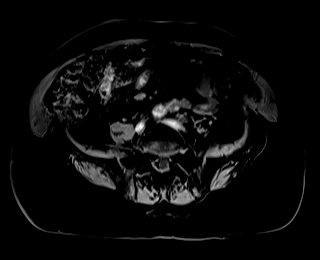
[im 44/88]
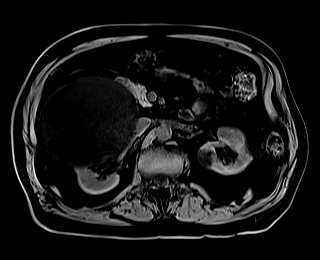
[im 88/88]
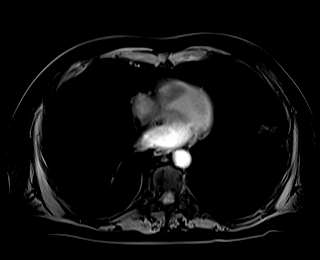

[Series 16: T1 dynamic · axial · 3.0mm · 1.25mm/px · z∈[-192,+69]mm · 3 of 88 slices shown (2 of 10)]
[im 1/88]
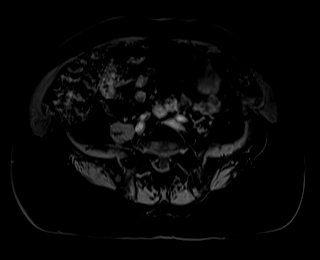
[im 44/88]
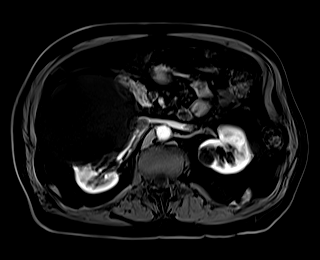
[im 88/88]
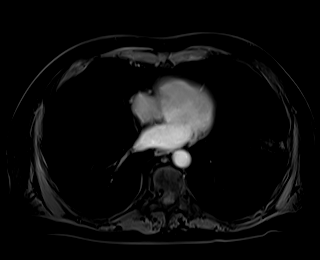

[Series 17: T1 dynamic · axial · 3.0mm · 1.25mm/px · z∈[-192,+69]mm · 3 of 88 slices shown (3 of 10)]
[im 1/88]
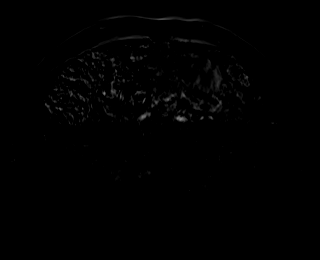
[im 44/88]
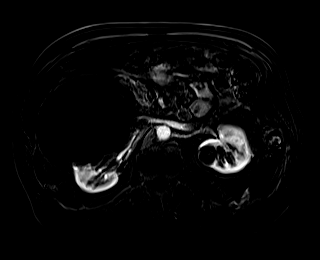
[im 88/88]
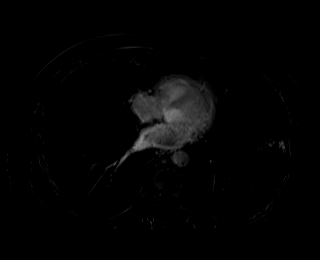

[Series 20: T1 dynamic · axial · 3.0mm · 1.25mm/px · z∈[-192,+69]mm · 3 of 88 slices shown (4 of 10)]
[im 1/88]
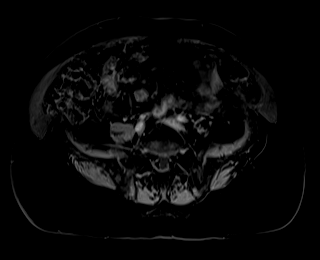
[im 44/88]
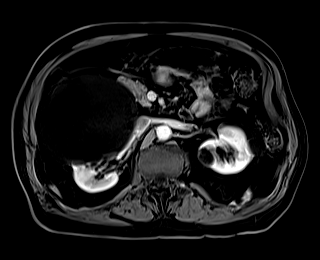
[im 88/88]
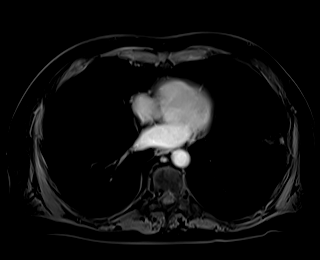

[Series 21: T1 dynamic · axial · 3.0mm · 1.25mm/px · z∈[-192,+69]mm · 3 of 88 slices shown (5 of 10)]
[im 1/88]
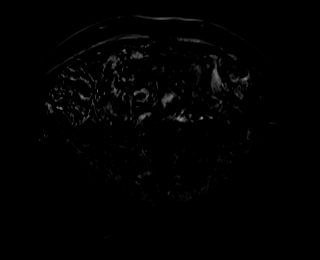
[im 44/88]
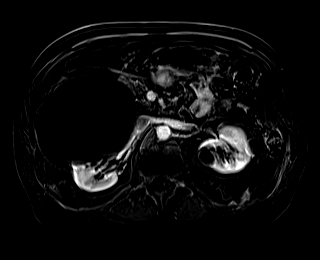
[im 88/88]
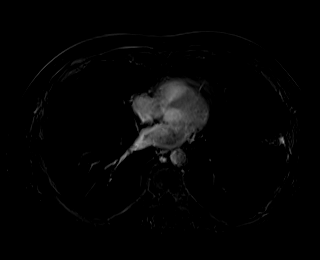

[Series 24: T1 dynamic · axial · 3.0mm · 1.25mm/px · z∈[-192,+69]mm · 3 of 88 slices shown (6 of 10)]
[im 1/88]
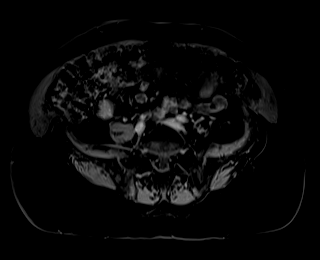
[im 44/88]
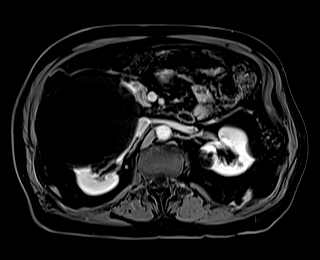
[im 88/88]
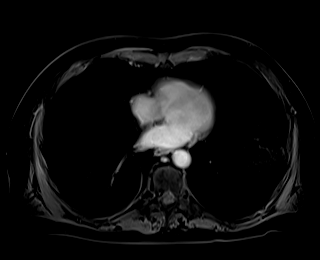

[Series 25: T1 dynamic · axial · 3.0mm · 1.25mm/px · z∈[-192,+69]mm · 3 of 88 slices shown (7 of 10)]
[im 1/88]
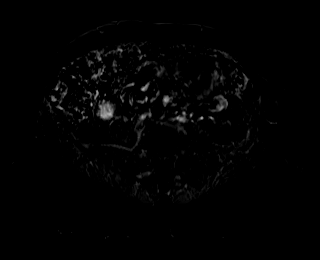
[im 44/88]
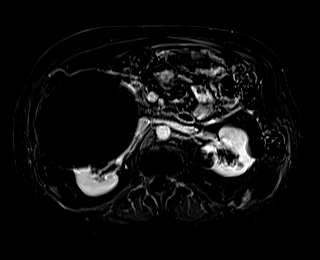
[im 88/88]
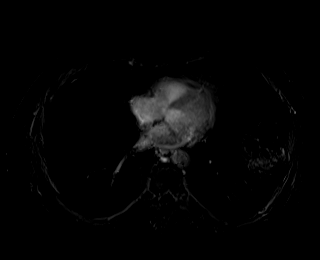

[Series 27: T1 dynamic · coronal · 5.0mm · 1.41mm/px · 2 of 52 slices shown (8 of 10)]
[im 1/52]
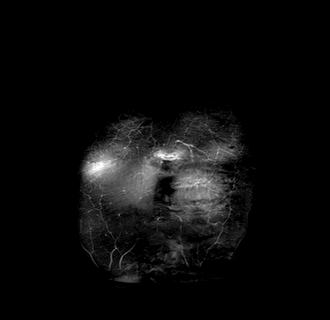
[im 52/52]
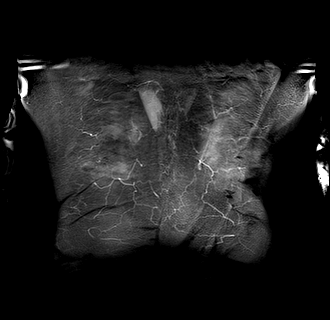

[Series 28: T2 · axial · 6.0mm · 1.56mm/px · z∈[-210,+85]mm · 2 of 42 slices shown (2 of 2)]
[im 1/42]
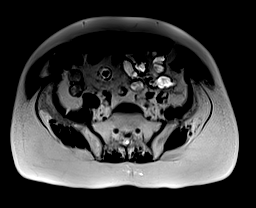
[im 42/42]
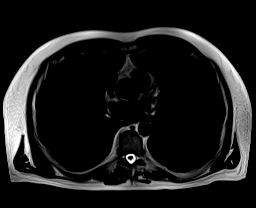

[Series 31: T1 dynamic · axial · 3.0mm · 1.25mm/px · z∈[-192,+69]mm · 3 of 88 slices shown (9 of 10)]
[im 1/88]
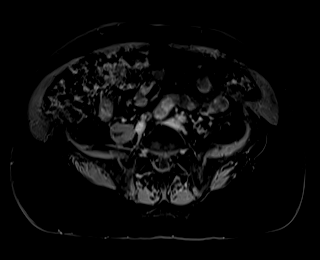
[im 44/88]
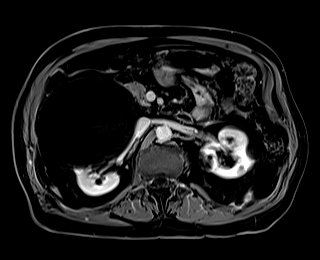
[im 88/88]
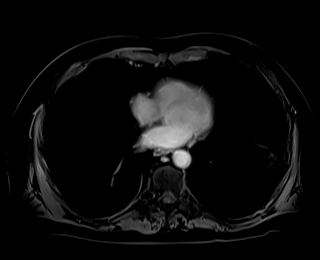

[Series 32: T1 dynamic · axial · 3.0mm · 1.25mm/px · z∈[-192,+69]mm · 3 of 88 slices shown (10 of 10)]
[im 1/88]
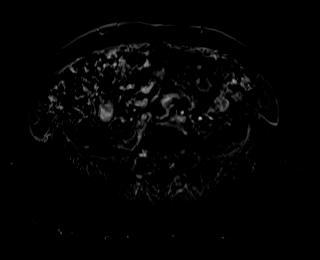
[im 44/88]
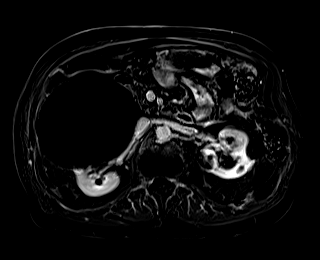
[im 88/88]
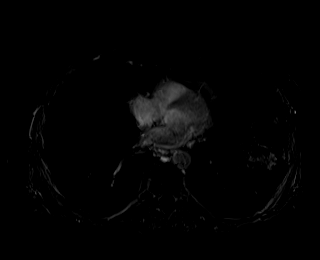

[48 of 48 positions shown; findings below may reference images not displayed]

FINDINGS: Lower chest: Incidental imaging of the lung bases without effusion.
No sign of consolidation. Limited assessment on MRI. Question of 8
mm RIGHT lower lobe pulmonary nodule. (Image 15/24)

Hepatobiliary: No significant biliary duct distension. No
pericholecystic stranding. No suspicious focal hepatic lesion.
Portal vein is patent.

Pancreas: No signs of Peri pancreatic stranding, preserved intrinsic
T1 signal in the pancreas. No visible lesion or significant duct
distension.

Spleen:  Normal spleen.

Adrenals/Urinary Tract: Adrenal glands are normal. Bilateral renal
cysts are noted without signs of hydronephrosis or perinephric
stranding.

Bilateral renal cysts with enlargement since 7351, largest on the
RIGHT measuring approximately 10 cm on the prior study arising from
the anterior RIGHT kidney now 13 cm greatest axial dimension.

No suspicious renal lesions on the RIGHT.

LEFT kidney with a cyst arising from the upper pole that has
enlarged measuring 4.9 cm extending cephalad from the RIGHT kidney
medially previously 3.3 cm. Just below this is a septated cystic
lesion. Some respiratory motion in the area of the lesion limiting
assessment. Greatest single axial measurement at 2.5 x 1.8 cm,
previously approximately 1.5 cm.

Thin septations, less than 3 total septations are demonstrated. No
sign of internal enhancement. No nodular enhancement. There is
however some irregularity along the medial margin, unclear whether
this represents a second, adjacent cyst along the deep margin of the
lesion or an irregular interface with the adjacent parenchyma. This
finding is best illustrated on image 21 of series 28.

Stomach/Bowel: No acute gastrointestinal process to the extent
evaluated on abdominal MRI, not performed for dedicated bowel
evaluation.

Vascular/Lymphatic: Patent abdominal vessels. No adenopathy in the
abdomen.

Other:  No ascites

Musculoskeletal: No suspicious bone lesions identified.
IMPRESSION: 1. Enlarging area in the medial upper pole the LEFT kidney is
favored to represent a Bosniak category 2 cyst with an adjacent
smaller cysts though this cannot be definitively confirmed based on
current imaging due to some respiratory variation. Consider
six-month follow-up with CT at which time this may be cleared
definitively. Characterized as a Bosniak II F at this time.
2. Simple cysts elsewhere in the kidneys with enlargement.
3. Question of 8 mm RIGHT lower lobe pulmonary nodule. This could
simply represent artifact or scarring just above the RIGHT
hemidiaphragm. Attention to this area on subsequent imaging is
suggested.

## 2022-06-07 DIAGNOSIS — D2272 Melanocytic nevi of left lower limb, including hip: Secondary | ICD-10-CM | POA: Diagnosis not present

## 2022-06-07 DIAGNOSIS — Z85828 Personal history of other malignant neoplasm of skin: Secondary | ICD-10-CM | POA: Diagnosis not present

## 2022-06-07 DIAGNOSIS — L814 Other melanin hyperpigmentation: Secondary | ICD-10-CM | POA: Diagnosis not present

## 2022-06-07 DIAGNOSIS — D225 Melanocytic nevi of trunk: Secondary | ICD-10-CM | POA: Diagnosis not present

## 2022-06-07 DIAGNOSIS — D1801 Hemangioma of skin and subcutaneous tissue: Secondary | ICD-10-CM | POA: Diagnosis not present

## 2022-06-07 DIAGNOSIS — L821 Other seborrheic keratosis: Secondary | ICD-10-CM | POA: Diagnosis not present

## 2022-06-07 DIAGNOSIS — L570 Actinic keratosis: Secondary | ICD-10-CM | POA: Diagnosis not present

## 2022-07-25 DIAGNOSIS — R600 Localized edema: Secondary | ICD-10-CM | POA: Diagnosis not present

## 2022-07-25 DIAGNOSIS — E559 Vitamin D deficiency, unspecified: Secondary | ICD-10-CM | POA: Diagnosis not present

## 2022-07-25 DIAGNOSIS — E782 Mixed hyperlipidemia: Secondary | ICD-10-CM | POA: Diagnosis not present

## 2022-07-25 DIAGNOSIS — I10 Essential (primary) hypertension: Secondary | ICD-10-CM | POA: Diagnosis not present

## 2022-07-25 DIAGNOSIS — Z136 Encounter for screening for cardiovascular disorders: Secondary | ICD-10-CM | POA: Diagnosis not present

## 2022-07-25 DIAGNOSIS — R0602 Shortness of breath: Secondary | ICD-10-CM | POA: Diagnosis not present

## 2022-07-25 DIAGNOSIS — Z23 Encounter for immunization: Secondary | ICD-10-CM | POA: Diagnosis not present

## 2022-07-25 DIAGNOSIS — M810 Age-related osteoporosis without current pathological fracture: Secondary | ICD-10-CM | POA: Diagnosis not present

## 2022-07-25 DIAGNOSIS — Z0000 Encounter for general adult medical examination without abnormal findings: Secondary | ICD-10-CM | POA: Diagnosis not present

## 2022-07-25 DIAGNOSIS — R7301 Impaired fasting glucose: Secondary | ICD-10-CM | POA: Diagnosis not present

## 2022-07-25 DIAGNOSIS — Z13220 Encounter for screening for lipoid disorders: Secondary | ICD-10-CM | POA: Diagnosis not present

## 2022-07-26 DIAGNOSIS — Z1211 Encounter for screening for malignant neoplasm of colon: Secondary | ICD-10-CM | POA: Diagnosis not present

## 2022-08-09 DIAGNOSIS — H35711 Central serous chorioretinopathy, right eye: Secondary | ICD-10-CM | POA: Diagnosis not present

## 2022-08-09 DIAGNOSIS — H353122 Nonexudative age-related macular degeneration, left eye, intermediate dry stage: Secondary | ICD-10-CM | POA: Diagnosis not present

## 2022-10-03 ENCOUNTER — Other Ambulatory Visit: Payer: Self-pay | Admitting: Oncology

## 2022-10-03 DIAGNOSIS — C61 Malignant neoplasm of prostate: Secondary | ICD-10-CM

## 2022-10-03 DIAGNOSIS — R911 Solitary pulmonary nodule: Secondary | ICD-10-CM

## 2022-10-11 ENCOUNTER — Ambulatory Visit (HOSPITAL_COMMUNITY)
Admission: RE | Admit: 2022-10-11 | Discharge: 2022-10-11 | Disposition: A | Discharge status: Home / Self Care | Payer: Medicare Other | Source: Ambulatory Visit | Attending: Oncology | Admitting: Oncology

## 2022-10-11 DIAGNOSIS — R911 Solitary pulmonary nodule: Secondary | ICD-10-CM | POA: Diagnosis not present

## 2022-10-11 DIAGNOSIS — R918 Other nonspecific abnormal finding of lung field: Secondary | ICD-10-CM | POA: Diagnosis not present

## 2022-10-11 DIAGNOSIS — C61 Malignant neoplasm of prostate: Secondary | ICD-10-CM | POA: Diagnosis present

## 2022-10-17 ENCOUNTER — Other Ambulatory Visit: Payer: Self-pay

## 2022-10-17 ENCOUNTER — Inpatient Hospital Stay: Payer: Medicare Other | Attending: Oncology | Admitting: Oncology

## 2022-10-17 VITALS — BP 140/90 | HR 72 | Temp 97.2°F | Resp 16 | Wt 185.0 lb

## 2022-10-17 DIAGNOSIS — C61 Malignant neoplasm of prostate: Secondary | ICD-10-CM | POA: Diagnosis not present

## 2022-10-17 DIAGNOSIS — Z8546 Personal history of malignant neoplasm of prostate: Secondary | ICD-10-CM | POA: Insufficient documentation

## 2022-10-17 DIAGNOSIS — Z79899 Other long term (current) drug therapy: Secondary | ICD-10-CM | POA: Insufficient documentation

## 2022-10-17 DIAGNOSIS — R911 Solitary pulmonary nodule: Secondary | ICD-10-CM

## 2022-10-17 NOTE — Progress Notes (Signed)
Hematology and Oncology Follow Up Visit  Alan Rivera [id number] 02-27-1946 76 y.o. 10/17/2022 11:56 AM Cari Caraway, MDMcNeill, Abigail Butts, MD   Principle Diagnosis: 76 year old man with left lung nodule diagnosed in May 2022.  He was found to have a 13 mm mass with differential diagnosis of low-grade malignancy versus benign findings.   Prior Therapy:  He is status post definitive therapy with radiation for Gleason score 9 prostate cancer in 2018.  Current therapy: Active surveillance  Interim History: Mr. Alan Rivera returns today for repeat evaluation.  Since the last visit, he reports no major changes in his health.  He denies any nausea, vomiting or abdominal pain.  He denies any respiratory complaints.  He denies any cough, wheezing or hemoptysis.  He denies any chest pain.     Medications: Reviewed without changes. Current Outpatient Medications  Medication Sig Dispense Refill   alendronate (FOSAMAX) 70 MG tablet Take 70 mg by mouth once a week. Take with a full glass of water on an empty stomach. (Patient not taking: Reported on 04/18/2022)     atorvastatin (LIPITOR) 10 MG tablet Take 10 mg by mouth daily.     COVID-19 mRNA bivalent vaccine, Pfizer, (PFIZER COVID-19 VAC BIVALENT) injection Inject into the muscle. 0.3 mL 0   EPINEPHrine (EPIPEN JR) 0.15 MG/0.3ML injection Inject 0.15 mg into the muscle as needed for anaphylaxis.     lisinopril (PRINIVIL,ZESTRIL) 10 MG tablet Take 10 mg by mouth daily.     Omega 3-6-9 Fatty Acids (OMEGA-3-6-9 PO) Take 750 mg by mouth in the morning and at bedtime.     oxyCODONE (OXY IR/ROXICODONE) 5 MG immediate release tablet Take 1 tablet (5 mg total) by mouth every 6 (six) hours as needed for severe pain. (Patient not taking: Reported on 04/18/2022) 12 tablet 0   No current facility-administered medications for this visit.     Allergies:  Allergies  Allergen Reactions   Shellfish Allergy Anaphylaxis      Physical Exam:  Blood  pressure (!) 140/90, pulse 72, temperature (!) 97.2 F (36.2 C), temperature source Temporal, resp. rate 16, weight 185 lb (83.9 kg), SpO2 97 %.   ECOG: 1   General appearance: Comfortable appearing without any discomfort Head: Normocephalic without any trauma Oropharynx: Mucous membranes are moist and pink without any thrush or ulcers. Eyes: Pupils are equal and round reactive to light. Lymph nodes: No cervical, supraclavicular, inguinal or axillary lymphadenopathy.   Heart:regular rate and rhythm.  S1 and S2 without leg edema. Lung: Clear without any rhonchi or wheezes.  No dullness to percussion. Abdomin: Soft, nontender, nondistended with good bowel sounds.  No hepatosplenomegaly. Musculoskeletal: No joint deformity or effusion.  Full range of motion noted. Neurological: No deficits noted on motor, sensory and deep tendon reflex exam. Skin: No petechial rash or dryness.  Appeared moist.       Lab Results: Lab Results  Component Value Date   WBC 5.6 04/15/2020   HGB 13.5 04/15/2020   HCT 40.8 04/15/2020   MCV 93.2 04/15/2020   PLT 176 04/15/2020     Chemistry      Component Value Date/Time   NA 141 04/15/2020 1525   K 3.9 04/15/2020 1525   CL 111 04/15/2020 1525   CO2 25 04/15/2020 1525   BUN 21 04/15/2020 1525   CREATININE 0.80 10/11/2021 1131      Component Value Date/Time   CALCIUM 8.6 (L) 04/15/2020 1525   ALKPHOS 53 04/15/2020 1525   AST 20 04/15/2020 1525  ALT 17 04/15/2020 1525   BILITOT 0.6 04/15/2020 1525         Study Result  Narrative & Impression  CLINICAL DATA:  Pulmonary nodule.   EXAM: CT CHEST WITHOUT CONTRAST   TECHNIQUE: Multidetector CT imaging of the chest was performed following the standard protocol without IV contrast.   RADIATION DOSE REDUCTION: This exam was performed according to the departmental dose-optimization program which includes automated exposure control, adjustment of the mA and/or kV according to patient size  and/or use of iterative reconstruction technique.   COMPARISON:  04/17/2022   FINDINGS: Cardiovascular: The heart size is normal. No substantial pericardial effusion. Coronary artery calcification is evident. Mild atherosclerotic calcification is noted in the wall of the thoracic aorta.   Mediastinum/Nodes: No mediastinal lymphadenopathy. No evidence for gross hilar lymphadenopathy although assessment is limited by the lack of intravenous contrast on the current study. The esophagus has normal imaging features. There is no axillary lymphadenopathy.   Lungs/Pleura: 9 x 7 mm left upper lobe pulmonary nodule measured previously is stable at 9 x 7 mm today.   5 mm right lower lobe pulmonary nodule measured previously is 4 mm today on image 102/5.   No new suspicious pulmonary nodule or mass. No focal airspace consolidation. No pleural effusion. Chronic subsegmental atelectasis or linear scarring in the anterior left lower lobe is stable.   Upper Abdomen: Similar appearance large exophytic cyst in the right kidney, incompletely visualized. Additional cystic lesions in both kidneys are similar to prior but incompletely visualized today.   Musculoskeletal: No worrisome lytic or sclerotic osseous abnormality.   IMPRESSION: 1. Stable 9 x 7 mm left upper lobe pulmonary nodule. Interval stability remains reassuring. PET-CT 05/04/2021 showed no substantial hypermetabolism. As well differentiated or low-grade neoplasm can be poorly FDG avid, continued surveillance may be warranted. 2. 5 mm right lower lobe pulmonary nodule measured previously is 4 mm today. This is most compatible with benign etiology. 3. Bilateral renal cysts, incompletely visualized. MRI 03/21/2021 better characterize these findings and identified a Bosniak II F lesion warranting follow-up. Reader is directed to the report for that exam. 4.  Aortic Atherosclerosis (ICD10-I70.0).      Impression and  Plan:  76 year old with:  1.  Left upper lobe pulmonary nodule diagnosed in May 2022.    CT scan obtained on October 11, 2022 was personally reviewed and continues to show stable 9 x 7 mm left upper lobe nodule with interval stability and no PET activity.  Differential diagnosis include predominantly benign etiology although low-grade malignancy could not be completely ruled out.  Options of management reviewed at this time including discontinuing follow-up versus repeat the scan in 12 months.  After discussion today, we opted to repeat his scan in 12 months and consider discontinuation after that.     2.  Prostate cancer: No evidence of relapsed disease.  His pulmonary nodules unrelated.     3.  Follow-up: In 12 months for repeat follow-up.   30  minutes were spent on this encounter.  The time was dedicated to updating disease status, treatment choices and outlining future plan of care review.         Zola Button, MD 12/12/202311:56 AM

## 2023-02-14 DIAGNOSIS — H353122 Nonexudative age-related macular degeneration, left eye, intermediate dry stage: Secondary | ICD-10-CM | POA: Diagnosis not present

## 2023-02-14 DIAGNOSIS — H35711 Central serous chorioretinopathy, right eye: Secondary | ICD-10-CM | POA: Diagnosis not present

## 2023-03-15 DIAGNOSIS — I700 Atherosclerosis of aorta: Secondary | ICD-10-CM | POA: Diagnosis not present

## 2023-03-15 DIAGNOSIS — I10 Essential (primary) hypertension: Secondary | ICD-10-CM | POA: Diagnosis not present

## 2023-04-11 DIAGNOSIS — C61 Malignant neoplasm of prostate: Secondary | ICD-10-CM | POA: Diagnosis not present

## 2023-04-18 DIAGNOSIS — R351 Nocturia: Secondary | ICD-10-CM | POA: Diagnosis not present

## 2023-04-18 DIAGNOSIS — C61 Malignant neoplasm of prostate: Secondary | ICD-10-CM | POA: Diagnosis not present

## 2023-04-18 DIAGNOSIS — N401 Enlarged prostate with lower urinary tract symptoms: Secondary | ICD-10-CM | POA: Diagnosis not present

## 2023-06-25 DIAGNOSIS — L570 Actinic keratosis: Secondary | ICD-10-CM | POA: Diagnosis not present

## 2023-06-25 DIAGNOSIS — L245 Irritant contact dermatitis due to other chemical products: Secondary | ICD-10-CM | POA: Diagnosis not present

## 2023-06-25 DIAGNOSIS — Z85828 Personal history of other malignant neoplasm of skin: Secondary | ICD-10-CM | POA: Diagnosis not present

## 2023-06-25 DIAGNOSIS — L821 Other seborrheic keratosis: Secondary | ICD-10-CM | POA: Diagnosis not present

## 2023-08-14 DIAGNOSIS — I10 Essential (primary) hypertension: Secondary | ICD-10-CM | POA: Diagnosis not present

## 2023-08-14 DIAGNOSIS — I700 Atherosclerosis of aorta: Secondary | ICD-10-CM | POA: Diagnosis not present

## 2023-08-14 DIAGNOSIS — M8580 Other specified disorders of bone density and structure, unspecified site: Secondary | ICD-10-CM | POA: Diagnosis not present

## 2023-08-14 DIAGNOSIS — Z8739 Personal history of other diseases of the musculoskeletal system and connective tissue: Secondary | ICD-10-CM | POA: Diagnosis not present

## 2023-08-14 DIAGNOSIS — E559 Vitamin D deficiency, unspecified: Secondary | ICD-10-CM | POA: Diagnosis not present

## 2023-08-15 ENCOUNTER — Other Ambulatory Visit: Payer: Self-pay | Admitting: Family Medicine

## 2023-08-15 DIAGNOSIS — M8580 Other specified disorders of bone density and structure, unspecified site: Secondary | ICD-10-CM

## 2023-08-22 DIAGNOSIS — Z1211 Encounter for screening for malignant neoplasm of colon: Secondary | ICD-10-CM | POA: Diagnosis not present

## 2023-08-22 DIAGNOSIS — Z1212 Encounter for screening for malignant neoplasm of rectum: Secondary | ICD-10-CM | POA: Diagnosis not present

## 2023-09-04 DIAGNOSIS — Z85828 Personal history of other malignant neoplasm of skin: Secondary | ICD-10-CM | POA: Diagnosis not present

## 2023-09-04 DIAGNOSIS — D485 Neoplasm of uncertain behavior of skin: Secondary | ICD-10-CM | POA: Diagnosis not present

## 2023-09-04 DIAGNOSIS — D1801 Hemangioma of skin and subcutaneous tissue: Secondary | ICD-10-CM | POA: Diagnosis not present

## 2023-09-04 DIAGNOSIS — L570 Actinic keratosis: Secondary | ICD-10-CM | POA: Diagnosis not present

## 2023-09-04 DIAGNOSIS — C44219 Basal cell carcinoma of skin of left ear and external auricular canal: Secondary | ICD-10-CM | POA: Diagnosis not present

## 2023-09-04 DIAGNOSIS — L821 Other seborrheic keratosis: Secondary | ICD-10-CM | POA: Diagnosis not present

## 2023-09-10 DIAGNOSIS — M6281 Muscle weakness (generalized): Secondary | ICD-10-CM | POA: Diagnosis not present

## 2023-09-20 DIAGNOSIS — M6281 Muscle weakness (generalized): Secondary | ICD-10-CM | POA: Diagnosis not present

## 2023-09-25 DIAGNOSIS — M6281 Muscle weakness (generalized): Secondary | ICD-10-CM | POA: Diagnosis not present

## 2023-10-02 DIAGNOSIS — M6281 Muscle weakness (generalized): Secondary | ICD-10-CM | POA: Diagnosis not present

## 2023-10-09 ENCOUNTER — Encounter (HOSPITAL_COMMUNITY): Payer: Self-pay

## 2023-10-09 ENCOUNTER — Ambulatory Visit (HOSPITAL_COMMUNITY)
Admission: RE | Admit: 2023-10-09 | Discharge: 2023-10-09 | Disposition: A | Discharge status: Home / Self Care | Payer: Medicare HMO | Source: Ambulatory Visit | Attending: Oncology | Admitting: Oncology

## 2023-10-09 DIAGNOSIS — R911 Solitary pulmonary nodule: Secondary | ICD-10-CM | POA: Insufficient documentation

## 2023-10-09 DIAGNOSIS — G14 Postpolio syndrome: Secondary | ICD-10-CM | POA: Diagnosis not present

## 2023-10-09 DIAGNOSIS — C61 Malignant neoplasm of prostate: Secondary | ICD-10-CM | POA: Diagnosis not present

## 2023-10-09 DIAGNOSIS — I700 Atherosclerosis of aorta: Secondary | ICD-10-CM | POA: Diagnosis not present

## 2023-10-09 DIAGNOSIS — Z9989 Dependence on other enabling machines and devices: Secondary | ICD-10-CM | POA: Diagnosis not present

## 2023-10-09 DIAGNOSIS — R918 Other nonspecific abnormal finding of lung field: Secondary | ICD-10-CM | POA: Diagnosis not present

## 2023-10-09 DIAGNOSIS — M6281 Muscle weakness (generalized): Secondary | ICD-10-CM | POA: Diagnosis not present

## 2023-10-16 DIAGNOSIS — M6281 Muscle weakness (generalized): Secondary | ICD-10-CM | POA: Diagnosis not present

## 2023-10-17 ENCOUNTER — Other Ambulatory Visit: Payer: Self-pay

## 2023-10-17 ENCOUNTER — Inpatient Hospital Stay: Payer: Medicare HMO | Attending: Internal Medicine | Admitting: Internal Medicine

## 2023-10-17 VITALS — BP 124/70 | HR 68 | Temp 98.2°F | Resp 14 | Ht 67.0 in | Wt 182.5 lb

## 2023-10-17 DIAGNOSIS — Z923 Personal history of irradiation: Secondary | ICD-10-CM | POA: Diagnosis not present

## 2023-10-17 DIAGNOSIS — Z8546 Personal history of malignant neoplasm of prostate: Secondary | ICD-10-CM | POA: Diagnosis not present

## 2023-10-17 DIAGNOSIS — Z79899 Other long term (current) drug therapy: Secondary | ICD-10-CM | POA: Diagnosis not present

## 2023-10-17 DIAGNOSIS — C3490 Malignant neoplasm of unspecified part of unspecified bronchus or lung: Secondary | ICD-10-CM | POA: Diagnosis not present

## 2023-10-17 DIAGNOSIS — R918 Other nonspecific abnormal finding of lung field: Secondary | ICD-10-CM | POA: Diagnosis not present

## 2023-10-17 DIAGNOSIS — H3530 Unspecified macular degeneration: Secondary | ICD-10-CM | POA: Diagnosis not present

## 2023-10-17 DIAGNOSIS — Z87891 Personal history of nicotine dependence: Secondary | ICD-10-CM | POA: Insufficient documentation

## 2023-10-17 NOTE — Progress Notes (Signed)
[facility] [phone]   [phone]  OFFICE PROGRESS NOTE  Gweneth Dimitri, MD [address] [place] [address]  DIAGNOSIS:  left lung nodule diagnosed in May 2022.  He was found to have a 13 mm mass with differential diagnosis of low-grade malignancy versus benign findings.   PRIOR THERAPY: status post definitive therapy with radiation for Gleason score 9 prostate cancer in 2018.   CURRENT THERAPY: Observe patient.  INTERVAL HISTORY: Alan Rivera 77 y.o. male came to the clinic today to establish care with me after his primary oncologist Dr. Clelia Croft left the practice.Discussed the use of AI scribe software for clinical note transcription with the patient, who gave verbal consent to proceed.  History of Present Illness   Alan Rivera, a 77 year old patient with a history of prostate cancer treated with radiation and hormonal therapy in 2017, has been under surveillance for multiple lung nodules. The nodules were identified on previous scans and have been monitored without any biopsy or intervention. The patient recalls undergoing both CT and PET scans in the past, but does not remember the specific findings.  The patient reports no new or worsening symptoms since the last visit a year ago. Specifically, there have been no complaints of chest pain, shortness of breath, cough, nausea, vomiting, diarrhea, or headaches. 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  The patient's medication regimen includes Fosamax, Lisinopril, Atorvastatin, and an EpiPen. The patient also takes a variety of supplements, including vitamins D and C. The patient denies current use of oxycodone, stating it was previously used for a tooth infection.       MEDICAL HISTORY: Past Medical History:  Diagnosis Date   Bilateral renal cysts    Diverticulosis    ED (erectile dysfunction)    Family  history of malignant neoplasm of prostate    Frequency-urgency syndrome    HOH (hard of hearing)    left side   HTN (hypertension) 09/18/2016   Hypertension    Impaired fasting glucose    Irregular heartbeat    Malignant neoplasm of prostate (HCC) 09/18/2016   Osteoporosis    Polio    age 32-- effected bilateral feet--  currently bilateral diffuse weakness upper and lower extremities   Post-polio syndrome    bilateral diffuse weakness upper and lower extremities, ambulates 50-70 yards   Prostate cancer (HCC) UROLOGIST-  [name]/  ONCOLOGIST -- [name]   dx 10/ 2017-- Stage T2a,  Gleason 4+5,  PSA 6.68, vol 43cc   Umbilical hernia    Uses walker    prn   Wears glasses     ALLERGIES:  is allergic to shellfish allergy.  MEDICATIONS:  Current Outpatient Medications  Medication Sig Dispense Refill   alendronate (FOSAMAX) 70 MG tablet Take 70 mg by mouth once a week. Take with a full glass of water on an empty stomach. (Patient not taking: Reported on 04/18/2022)     atorvastatin (LIPITOR) 10 MG tablet Take 10 mg by mouth daily.     COVID-19 mRNA bivalent vaccine, Pfizer, (PFIZER COVID-19 VAC BIVALENT) injection Inject into the muscle. 0.3 mL 0   EPINEPHrine (EPIPEN JR) 0.15 MG/0.3ML injection Inject 0.15 mg into the muscle as needed for anaphylaxis.     lisinopril (PRINIVIL,ZESTRIL) 10 MG tablet Take 10 mg by mouth daily.     Omega 3-6-9 Fatty Acids (OMEGA-3-6-9 PO) Take 750 mg by  mouth in the morning and at bedtime.     oxyCODONE (OXY IR/ROXICODONE) 5 MG immediate release tablet Take 1 tablet (5 mg total) by mouth every 6 (six) hours as needed for severe pain. (Patient not taking: Reported on 04/18/2022) 12 tablet 0   No current facility-administered medications for this visit.    SURGICAL HISTORY:  Past Surgical History:  Procedure Laterality Date   FOOT SURGERY Bilateral child   l related to effects of polio   PROSTATE BIOPSY     PROSTATE BIOPSY N/A 01/05/2017    Procedure: PROSTATE ULTRASOUND WITH SPACE OAR;  Surgeon: Malen Gauze, MD;  Location: [facility];  Service: Urology;  Laterality: N/A;   SHOULDER ARTHROSCOPY WITH OPEN ROTATOR CUFF REPAIR Bilateral right 04/30/2007;  left 10-15-2007   w/ Debridement/  DCR/  SAD/  Acromioplasty/  Bursectomy   TONSILLECTOMY AND ADENOIDECTOMY      REVIEW OF SYSTEMS:  Constitutional: negative Eyes: negative Ears, nose, mouth, throat, and face: negative Respiratory: negative Cardiovascular: negative Gastrointestinal: negative Genitourinary:negative Integument/breast: negative Hematologic/lymphatic: negative Musculoskeletal:positive for arthralgias Neurological: negative Behavioral/Psych: negative Endocrine: negative Allergic/Immunologic: negative   PHYSICAL EXAMINATION: General appearance: alert, cooperative, fatigued, and no distress Head: Normocephalic, without obvious abnormality, atraumatic Neck: no adenopathy, no JVD, supple, symmetrical, trachea midline, and thyroid not enlarged, symmetric, no tenderness/mass/nodules Lymph nodes: Cervical, supraclavicular, and axillary nodes normal. Resp: clear to auscultation bilaterally Back: symmetric, no curvature. ROM normal. No CVA tenderness. Cardio: regular rate and rhythm, S1, S2 normal, no murmur, click, rub or gallop GI: soft, non-tender; bowel sounds normal; no masses,  no organomegaly Extremities: extremities normal, atraumatic, no cyanosis or edema Neurologic: Alert and oriented X 3, normal strength and tone. Normal symmetric reflexes. Normal coordination and gait  ECOG PERFORMANCE STATUS: 1 - Symptomatic but completely ambulatory  Blood pressure 124/70, pulse 68, temperature 98.2 F (36.8 C), temperature source Temporal, resp. rate 14, height 5\' 7"  (1.702 m), weight 182 lb 8 oz (82.8 kg), SpO2 98%.  LABORATORY DATA: Lab Results  Component Value Date   WBC 5.6 04/15/2020   HGB 13.5 04/15/2020   HCT 40.8 04/15/2020   MCV  93.2 04/15/2020   PLT 176 04/15/2020      Chemistry      Component Value Date/Time   NA 141 04/15/2020 1525   K 3.9 04/15/2020 1525   CL 111 04/15/2020 1525   CO2 25 04/15/2020 1525   BUN 21 04/15/2020 1525   CREATININE 0.80 10/11/2021 1131      Component Value Date/Time   CALCIUM 8.6 (L) 04/15/2020 1525   ALKPHOS 53 04/15/2020 1525   AST 20 04/15/2020 1525   ALT 17 04/15/2020 1525   BILITOT 0.6 04/15/2020 1525       RADIOGRAPHIC STUDIES: CT Chest Wo Contrast  Result Date: 10/16/2023 CLINICAL DATA:  Follow-up pulmonary nodule, prostate cancer EXAM: CT CHEST WITHOUT CONTRAST TECHNIQUE: Multidetector CT imaging of the chest was performed following the standard protocol without IV contrast. RADIATION DOSE REDUCTION: This exam was performed according to the departmental dose-optimization program which includes automated exposure control, adjustment of the mA and/or kV according to patient size and/or use of iterative reconstruction technique. COMPARISON:  10/11/2022 FINDINGS: Cardiovascular: The heart is normal in size. No pericardial effusion. No evidence of thoracic aortic aneurysm. Mild atherosclerotic calcifications of the aortic arch. Mild coronary atherosclerosis of the LAD. Mediastinum/Nodes: No suspicious mediastinal lymphadenopathy. Visualized thyroid is unremarkable. Lungs/Pleura: 9 mm irregular left upper lobe nodule (series 8/image 36), unchanged, non FDG  avid on prior PET. 5 mm irregular subpleural nodule in the right lower lobe (series 8/image 111), unchanged, benign. No new/suspicious pulmonary nodules. Bronchiectasis with bronchial wall thickening, lower lobe predominant. Mild subpleural scarring in the bilateral lower lobes. No focal consolidation. No pleural effusion or pneumothorax. Upper Abdomen: Visualized upper abdomen is notable for bilateral renal cysts, including a 15.2 cm simple right lower pole renal cysts, benign (Bosniak I). No follow-up is recommended.  Musculoskeletal: Degenerative changes of the visualized thoracolumbar spine. IMPRESSION: 9 mm irregular left upper lobe nodule, unchanged, non FDG avid on prior PET. This is stable dating back to 2022. Additional follow-up CT in 1 year is considered optional. No findings suspicious for metastatic disease. Aortic Atherosclerosis (ICD10-I70.0). Electronically Signed   By: Charline Bills M.D.   On: 10/16/2023 20:32    ASSESSMENT AND PLAN: This is a very pleasant 77 years old white male with history of prostate adenocarcinoma diagnosed in 2017 status post radiotherapy as well as short course of hormonal therapy.  The patient also has suspicious pulmonary nodules in the left upper lobe as well as the right lower lobe that has been on close monitoring.    Lung Nodules Lung nodules in the left upper lobe (0.9 cm) and right lower lobe (5 mm) with no significant change from previous scans. No new symptoms such as cough, dyspnea, or hemoptysis. Previous PET scan showed no hyperactivity. Discussed monitoring for new symptoms and the need for follow-up scans. - Schedule follow-up scan in one year - Monitor for new symptoms such as cough, dyspnea, hemoptysis, or unintentional weight loss and report if he occurs  Prostate Cancer Prostate cancer treated with radiation in 2017. No current symptoms or active treatment required. Limited hormonal therapy. Routine monitoring as per oncology guidelines. 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 - Continue monitoring vision changes and follow up with ophthalmology as needed  General Health Maintenance On lisinopril and atorvastatin. Supplements include vitamin D and vitamin C. No recent use of Fosamax or oxycodone. - Continue current medications and supplements - Discontinue Fosamax as it has not been used for 7-8 years  Follow-up - Schedule  follow-up appointment in one year - Ensure scan is scheduled one week to ten days before the follow-up appointment.   The patient was advised to call immediately if he has any other concerning symptoms in the interval. The patient voices understanding of current disease status and treatment options and is in agreement with the current care plan.  All questions were answered. The patient knows to call the clinic with any problems, questions or concerns. We can certainly see the patient much sooner if necessary.  The total time spent in the appointment was 30 minutes.  Disclaimer: This note was dictated with voice recognition software. Similar sounding words can inadvertently be transcribed and may not be corrected upon review.

## 2023-10-23 DIAGNOSIS — M6281 Muscle weakness (generalized): Secondary | ICD-10-CM | POA: Diagnosis not present

## 2023-10-30 DIAGNOSIS — M6281 Muscle weakness (generalized): Secondary | ICD-10-CM | POA: Diagnosis not present

## 2024-02-19 DIAGNOSIS — Z85828 Personal history of other malignant neoplasm of skin: Secondary | ICD-10-CM | POA: Diagnosis not present

## 2024-02-19 DIAGNOSIS — C44219 Basal cell carcinoma of skin of left ear and external auricular canal: Secondary | ICD-10-CM | POA: Diagnosis not present

## 2024-02-19 DIAGNOSIS — L820 Inflamed seborrheic keratosis: Secondary | ICD-10-CM | POA: Diagnosis not present

## 2024-02-19 DIAGNOSIS — L570 Actinic keratosis: Secondary | ICD-10-CM | POA: Diagnosis not present

## 2024-03-12 DIAGNOSIS — I700 Atherosclerosis of aorta: Secondary | ICD-10-CM | POA: Diagnosis not present

## 2024-03-12 DIAGNOSIS — I10 Essential (primary) hypertension: Secondary | ICD-10-CM | POA: Diagnosis not present

## 2024-03-17 ENCOUNTER — Ambulatory Visit
Admission: RE | Admit: 2024-03-17 | Discharge: 2024-03-17 | Disposition: A | Discharge status: Home / Self Care | Payer: Medicare Other | Source: Ambulatory Visit | Attending: Family Medicine | Admitting: Family Medicine

## 2024-03-17 DIAGNOSIS — M8588 Other specified disorders of bone density and structure, other site: Secondary | ICD-10-CM | POA: Diagnosis not present

## 2024-03-17 DIAGNOSIS — M8580 Other specified disorders of bone density and structure, unspecified site: Secondary | ICD-10-CM

## 2024-03-21 DIAGNOSIS — H35711 Central serous chorioretinopathy, right eye: Secondary | ICD-10-CM | POA: Diagnosis not present

## 2024-07-22 ENCOUNTER — Emergency Department (HOSPITAL_BASED_OUTPATIENT_CLINIC_OR_DEPARTMENT_OTHER): Admitting: Radiology

## 2024-07-22 ENCOUNTER — Other Ambulatory Visit: Payer: Self-pay

## 2024-07-22 ENCOUNTER — Emergency Department (HOSPITAL_BASED_OUTPATIENT_CLINIC_OR_DEPARTMENT_OTHER)
Admission: EM | Admit: 2024-07-22 | Discharge: 2024-07-23 | Disposition: A | Discharge status: Home / Self Care | Attending: Emergency Medicine | Admitting: Emergency Medicine

## 2024-07-22 DIAGNOSIS — S52022A Displaced fracture of olecranon process without intraarticular extension of left ulna, initial encounter for closed fracture: Secondary | ICD-10-CM | POA: Insufficient documentation

## 2024-07-22 DIAGNOSIS — W010XXA Fall on same level from slipping, tripping and stumbling without subsequent striking against object, initial encounter: Secondary | ICD-10-CM | POA: Diagnosis not present

## 2024-07-22 DIAGNOSIS — I10 Essential (primary) hypertension: Secondary | ICD-10-CM | POA: Insufficient documentation

## 2024-07-22 DIAGNOSIS — Z8546 Personal history of malignant neoplasm of prostate: Secondary | ICD-10-CM | POA: Diagnosis not present

## 2024-07-22 DIAGNOSIS — Z79899 Other long term (current) drug therapy: Secondary | ICD-10-CM | POA: Diagnosis not present

## 2024-07-22 DIAGNOSIS — Z043 Encounter for examination and observation following other accident: Secondary | ICD-10-CM | POA: Diagnosis not present

## 2024-07-22 DIAGNOSIS — S60222A Contusion of left hand, initial encounter: Secondary | ICD-10-CM | POA: Insufficient documentation

## 2024-07-22 DIAGNOSIS — Y92252 Music hall as the place of occurrence of the external cause: Secondary | ICD-10-CM | POA: Insufficient documentation

## 2024-07-22 DIAGNOSIS — S59902A Unspecified injury of left elbow, initial encounter: Secondary | ICD-10-CM | POA: Diagnosis present

## 2024-07-22 DIAGNOSIS — S300XXA Contusion of lower back and pelvis, initial encounter: Secondary | ICD-10-CM | POA: Diagnosis not present

## 2024-07-22 LAB — CBC
HCT: 38.2 % — ABNORMAL LOW (ref 39.0–52.0)
Hemoglobin: 12.9 g/dL — ABNORMAL LOW (ref 13.0–17.0)
MCH: 31 pg (ref 26.0–34.0)
MCHC: 33.8 g/dL (ref 30.0–36.0)
MCV: 91.8 fL (ref 80.0–100.0)
Platelets: 165 K/uL (ref 150–400)
RBC: 4.16 MIL/uL — ABNORMAL LOW (ref 4.22–5.81)
RDW: 13.8 % (ref 11.5–15.5)
WBC: 6 K/uL (ref 4.0–10.5)
nRBC: 0 % (ref 0.0–0.2)

## 2024-07-22 LAB — COMPREHENSIVE METABOLIC PANEL WITH GFR
ALT: 13 U/L (ref 0–44)
AST: 22 U/L (ref 15–41)
Albumin: 3.9 g/dL (ref 3.5–5.0)
Alkaline Phosphatase: 91 U/L (ref 38–126)
Anion gap: 11 (ref 5–15)
BUN: 14 mg/dL (ref 8–23)
CO2: 24 mmol/L (ref 22–32)
Calcium: 9.4 mg/dL (ref 8.9–10.3)
Chloride: 106 mmol/L (ref 98–111)
Creatinine, Ser: 0.87 mg/dL (ref 0.61–1.24)
GFR, Estimated: >60 mL/min (ref >60)
Glucose, Bld: 144 mg/dL — ABNORMAL HIGH (ref 70–99)
Potassium: 3.8 mmol/L (ref 3.5–5.1)
Sodium: 141 mmol/L (ref 135–145)
Total Bilirubin: 0.3 mg/dL (ref 0.0–1.2)
Total Protein: 6.4 g/dL — ABNORMAL LOW (ref 6.5–8.1)

## 2024-07-22 NOTE — ED Triage Notes (Signed)
 [place], fell down on his left elbow, no lacerations and had full movement of the elbow but some pain in the joint when he moves it certain directions. The lower arm is swollen compared to the right with bruising all the way down his forearm into his fingers. He is not on blood thinners. The bruising and swelling terminates at the mid bicep.

## 2024-07-23 DIAGNOSIS — S52025A Nondisplaced fracture of olecranon process without intraarticular extension of left ulna, initial encounter for closed fracture: Secondary | ICD-10-CM | POA: Diagnosis not present
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
## 2024-07-23 NOTE — Discharge Instructions (Signed)
For pain control you may take 1000 mg of Tylenol every 8 hours as needed.  

## 2024-07-24 DIAGNOSIS — X58XXXA Exposure to other specified factors, initial encounter: Secondary | ICD-10-CM | POA: Diagnosis not present

## 2024-07-24 DIAGNOSIS — G8918 Other acute postprocedural pain: Secondary | ICD-10-CM | POA: Diagnosis not present

## 2024-07-24 DIAGNOSIS — Y929 Unspecified place or not applicable: Secondary | ICD-10-CM | POA: Diagnosis not present

## 2024-07-24 DIAGNOSIS — S52022A Displaced fracture of olecranon process without intraarticular extension of left ulna, initial encounter for closed fracture: Secondary | ICD-10-CM | POA: Diagnosis not present

## 2024-07-24 DIAGNOSIS — M7022 Olecranon bursitis, left elbow: Secondary | ICD-10-CM | POA: Diagnosis not present

## 2024-07-24 DIAGNOSIS — S52032A Displaced fracture of olecranon process with intraarticular extension of left ulna, initial encounter for closed fracture: Secondary | ICD-10-CM | POA: Diagnosis not present

## 2024-08-01 DIAGNOSIS — M7022 Olecranon bursitis, left elbow: Secondary | ICD-10-CM | POA: Diagnosis not present

## 2024-08-01 DIAGNOSIS — S40022A Contusion of left upper arm, initial encounter: Secondary | ICD-10-CM | POA: Diagnosis not present

## 2024-08-15 DIAGNOSIS — I700 Atherosclerosis of aorta: Secondary | ICD-10-CM | POA: Diagnosis not present

## 2024-08-15 DIAGNOSIS — I10 Essential (primary) hypertension: Secondary | ICD-10-CM | POA: Diagnosis not present

## 2024-08-15 DIAGNOSIS — Z23 Encounter for immunization: Secondary | ICD-10-CM | POA: Diagnosis not present

## 2024-08-15 DIAGNOSIS — Z0000 Encounter for general adult medical examination without abnormal findings: Secondary | ICD-10-CM | POA: Diagnosis not present

## 2024-08-15 DIAGNOSIS — E559 Vitamin D deficiency, unspecified: Secondary | ICD-10-CM | POA: Diagnosis not present

## 2024-08-15 DIAGNOSIS — M8580 Other specified disorders of bone density and structure, unspecified site: Secondary | ICD-10-CM | POA: Diagnosis not present

## 2024-08-29 DIAGNOSIS — S52022D Displaced fracture of olecranon process without intraarticular extension of left ulna, subsequent encounter for closed fracture with routine healing: Secondary | ICD-10-CM | POA: Diagnosis not present

## 2024-09-26 ENCOUNTER — Ambulatory Visit (HOSPITAL_COMMUNITY)
Admission: RE | Admit: 2024-09-26 | Discharge: 2024-09-26 | Disposition: A | Discharge status: Home / Self Care | Source: Ambulatory Visit | Attending: Internal Medicine | Admitting: Internal Medicine

## 2024-09-26 DIAGNOSIS — C3490 Malignant neoplasm of unspecified part of unspecified bronchus or lung: Secondary | ICD-10-CM | POA: Insufficient documentation

## 2024-10-07 ENCOUNTER — Inpatient Hospital Stay: Payer: Medicare HMO

## 2024-10-14 ENCOUNTER — Inpatient Hospital Stay

## 2024-10-14 ENCOUNTER — Inpatient Hospital Stay: Payer: Medicare HMO | Attending: Internal Medicine | Admitting: Internal Medicine

## 2024-10-14 VITALS — BP 136/77 | HR 72 | Temp 97.4°F | Resp 17 | Ht 67.0 in | Wt 187.2 lb

## 2024-10-14 DIAGNOSIS — R911 Solitary pulmonary nodule: Secondary | ICD-10-CM | POA: Insufficient documentation

## 2024-10-14 DIAGNOSIS — M899 Disorder of bone, unspecified: Secondary | ICD-10-CM | POA: Diagnosis present

## 2024-10-14 DIAGNOSIS — Z79899 Other long term (current) drug therapy: Secondary | ICD-10-CM | POA: Diagnosis not present

## 2024-10-14 DIAGNOSIS — Z8546 Personal history of malignant neoplasm of prostate: Secondary | ICD-10-CM | POA: Insufficient documentation

## 2024-10-14 DIAGNOSIS — C61 Malignant neoplasm of prostate: Secondary | ICD-10-CM | POA: Diagnosis not present

## 2024-10-14 DIAGNOSIS — C3490 Malignant neoplasm of unspecified part of unspecified bronchus or lung: Secondary | ICD-10-CM

## 2024-10-14 LAB — CBC WITH DIFFERENTIAL (CANCER CENTER ONLY)
Abs Immature Granulocytes: 0.02 K/uL (ref 0.00–0.07)
Basophils Absolute: 0.1 K/uL (ref 0.0–0.1)
Basophils Relative: 1 %
Eosinophils Absolute: 0.3 K/uL (ref 0.0–0.5)
Eosinophils Relative: 5 %
HCT: 43.7 % (ref 39.0–52.0)
Hemoglobin: 14.8 g/dL (ref 13.0–17.0)
Immature Granulocytes: 0 %
Lymphocytes Relative: 25 %
Lymphs Abs: 1.4 K/uL (ref 0.7–4.0)
MCH: 31.1 pg (ref 26.0–34.0)
MCHC: 33.9 g/dL (ref 30.0–36.0)
MCV: 91.8 fL (ref 80.0–100.0)
Monocytes Absolute: 0.8 K/uL (ref 0.1–1.0)
Monocytes Relative: 15 %
Neutro Abs: 2.9 K/uL (ref 1.7–7.7)
Neutrophils Relative %: 54 %
Platelet Count: 165 K/uL (ref 150–400)
RBC: 4.76 MIL/uL (ref 4.22–5.81)
RDW: 13.7 % (ref 11.5–15.5)
WBC Count: 5.4 K/uL (ref 4.0–10.5)
nRBC: 0 % (ref 0.0–0.2)

## 2024-10-14 LAB — CMP (CANCER CENTER ONLY)
ALT: 18 U/L (ref 0–44)
AST: 24 U/L (ref 15–41)
Albumin: 4 g/dL (ref 3.5–5.0)
Alkaline Phosphatase: 87 U/L (ref 38–126)
Anion gap: 9 (ref 5–15)
BUN: 15 mg/dL (ref 8–23)
CO2: 27 mmol/L (ref 22–32)
Calcium: 9.5 mg/dL (ref 8.9–10.3)
Chloride: 106 mmol/L (ref 98–111)
Creatinine: 0.77 mg/dL (ref 0.61–1.24)
GFR, Estimated: >60 mL/min (ref >60)
Glucose, Bld: 101 mg/dL — ABNORMAL HIGH (ref 70–99)
Potassium: 4.2 mmol/L (ref 3.5–5.1)
Sodium: 142 mmol/L (ref 135–145)
Total Bilirubin: 0.4 mg/dL (ref 0.0–1.2)
Total Protein: 6.6 g/dL (ref 6.5–8.1)

## 2024-10-14 NOTE — Progress Notes (Signed)
 [facility] [phone]   [phone]  OFFICE PROGRESS NOTE  Alan Motto, Alan Rivera [address] 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  He was found to have a 13 mm mass with differential diagnosis of low-grade malignancy versus benign findings.   PRIOR THERAPY: status post definitive therapy with radiation for Gleason score 9 prostate cancer in 2018.   CURRENT THERAPY: Observation  INTERVAL HISTORY: Alan Rivera 78 y.o. male returns to the clinic today for annual follow-up visit accompanied by his wife.Discussed the use of AI scribe software for clinical note transcription with the patient, who gave verbal consent to proceed.  History of Present Illness Alan Rivera is a 78 year old male with prostate cancer who presents for evaluation with a repeat CT scan of the chest for restaging of his disease. He is accompanied by his wife.  He has a history of prostate cancer diagnosed in 2018 with a Gleason score of nine and underwent definitive radiotherapy. His PSA level was zero in June.  In May 2022, a left lung nodule was identified, and he has been under observation since then.  He is anxious about the findings and has questions about the potential implications of the sclerotic lesions, including whether they could be related to his prostate cancer or another source of bone cancer. He mentions a fall in September where he broke his elbow, which he wonders might be related to the rib findings.  He traveled in 06-04-2024 due to a sudden death in the family, specifically the passing of his younger brother.    MEDICAL HISTORY: Past Medical History:  Diagnosis Date   Bilateral renal cysts    Diverticulosis    ED (erectile dysfunction)    Family history of malignant neoplasm of prostate    Frequency-urgency syndrome    HOH (hard  of hearing)    left side   HTN (hypertension) 09/18/2016   Hypertension    Impaired fasting glucose    Irregular heartbeat    Malignant neoplasm of prostate (HCC) 09/18/2016   Osteoporosis    Polio    age 46-- effected bilateral feet--  currently bilateral diffuse weakness upper and lower extremities   Post-polio syndrome    bilateral diffuse weakness upper and lower extremities, ambulates 50-70 yards   Prostate cancer (HCC) UROLOGIST-  [name]/  ONCOLOGIST -- [name]   dx 10/ 2017-- Stage T2a,  Gleason 4+5,  PSA 6.68, vol 43cc   Umbilical hernia    Uses walker    prn   Wears glasses     ALLERGIES:  is allergic to shellfish allergy.  MEDICATIONS:  Current Outpatient Medications  Medication Sig Dispense Refill   alendronate (FOSAMAX) 70 MG tablet Take 70 mg by mouth once a week. Take with a full glass of water on an empty stomach. (Patient not taking: Reported on 04/18/2022)     atorvastatin (LIPITOR) 10 MG tablet Take 10 mg by mouth daily.     COVID-19 mRNA bivalent vaccine, Pfizer, (PFIZER COVID-19 VAC BIVALENT) injection Inject into the muscle. 0.3 mL 0   EPINEPHrine (EPIPEN JR) 0.15 MG/0.3ML injection Inject 0.15 mg into the muscle as needed for anaphylaxis.     lisinopril (PRINIVIL,ZESTRIL) 10 MG tablet Take 10 mg by mouth daily.     Omega 3-6-9 Fatty Acids (OMEGA-3-6-9 PO) Take 750 mg by mouth  in the morning and at bedtime.     oxyCODONE  (OXY IR/ROXICODONE ) 5 MG immediate release tablet Take 1 tablet (5 mg total) by mouth every 6 (six) hours as needed for severe pain. (Patient not taking: Reported on 04/18/2022) 12 tablet 0   No current facility-administered medications for this visit.    SURGICAL HISTORY:  Past Surgical History:  Procedure Laterality Date   FOOT SURGERY Bilateral child   l related to effects of polio   PROSTATE BIOPSY     PROSTATE BIOPSY N/A 01/05/2017   Procedure: PROSTATE ULTRASOUND WITH SPACE OAR;  Surgeon: Belvie LITTIE Clara, MD;  Location:  [facility];  Service: Urology;  Laterality: N/A;   SHOULDER ARTHROSCOPY WITH OPEN ROTATOR CUFF REPAIR Bilateral right 04/30/2007;  left 10-15-2007   w/ Debridement/  DCR/  SAD/  Acromioplasty/  Bursectomy   TONSILLECTOMY AND ADENOIDECTOMY      REVIEW OF SYSTEMS:  Constitutional: positive for fatigue Eyes: negative Ears, nose, mouth, throat, and face: negative Respiratory: negative Cardiovascular: negative Gastrointestinal: negative Genitourinary:negative Integument/breast: negative Hematologic/lymphatic: negative Musculoskeletal:positive for arthralgias Neurological: negative Behavioral/Psych: negative Endocrine: negative Allergic/Immunologic: negative   PHYSICAL EXAMINATION: General appearance: alert, cooperative, fatigued, and no distress Head: Normocephalic, without obvious abnormality, atraumatic Neck: no adenopathy, no JVD, supple, symmetrical, trachea midline, and thyroid  not enlarged, symmetric, no tenderness/mass/nodules Lymph nodes: Cervical, supraclavicular, and axillary nodes normal. Resp: clear to auscultation bilaterally Back: symmetric, no curvature. ROM normal. No CVA tenderness. Cardio: regular rate and rhythm, S1, S2 normal, no murmur, click, rub or gallop GI: soft, non-tender; bowel sounds normal; no masses,  no organomegaly Extremities: extremities normal, atraumatic, no cyanosis or edema Neurologic: Alert and oriented X 3, normal strength and tone. Normal symmetric reflexes. Normal coordination and gait  ECOG PERFORMANCE STATUS: 1 - Symptomatic but completely ambulatory  Blood pressure 136/77, pulse 72, temperature (!) 97.4 F (36.3 C), temperature source Temporal, resp. rate 17, height 5' 7 (1.702 m), weight 187 lb 3.2 oz (84.9 kg), SpO2 98%.  LABORATORY DATA: Lab Results  Component Value Date   WBC 6.0 07/22/2024   HGB 12.9 (L) 07/22/2024   HCT 38.2 (L) 07/22/2024   MCV 91.8 07/22/2024   PLT 165 07/22/2024      Chemistry       Component Value Date/Time   NA 141 07/22/2024 2200   K 3.8 07/22/2024 2200   CL 106 07/22/2024 2200   CO2 24 07/22/2024 2200   BUN 14 07/22/2024 2200   CREATININE 0.87 07/22/2024 2200      Component Value Date/Time   CALCIUM 9.4 07/22/2024 2200   ALKPHOS 91 07/22/2024 2200   AST 22 07/22/2024 2200   ALT 13 07/22/2024 2200   BILITOT 0.3 07/22/2024 2200       RADIOGRAPHIC STUDIES: CT Chest Wo Contrast Result Date: 09/26/2024 CLINICAL DATA:  Non-small cell lung cancer, history of prostate cancer. EXAM: CT CHEST WITHOUT CONTRAST TECHNIQUE: Multidetector CT imaging of the chest was performed following the standard protocol without IV contrast. RADIATION DOSE REDUCTION: This exam was performed according to the departmental dose-optimization program which includes automated exposure control, adjustment of the mA and/or kV according to patient size and/or use of iterative reconstruction technique. COMPARISON:  Chest CT October 09, 2023. FINDINGS: Cardiovascular: The heart size is normal. Atherosclerotic calcifications of coronary arteries. No pericardial fluid. Mediastinum/Nodes: No enlarged mediastinal or axillary lymph nodes. Thyroid  gland, trachea, and esophagus demonstrate no significant findings. Lungs/Pleura: Bilateral scattered pulmonary nodules including a spiculated 9 mm nodule anterior  left upper lobe, stable to prior (5/75). Subpleural right lower lobe focal scarring versus subsolid nodule measuring 6 mm, stable (5/117). Additional scattered micronodules are stable for example right upper lobe 5/52 right lower lobe 5/135 No new pulmonary nodules. Diffuse bronchial and bronchiolar wall thickening. No pleural effusion. Upper Abdomen: Hiatal hernia. Bilateral renal cortical cysts measuring up to 11 cm on the right. Which does not require imaging follow-up. Evaluation of renal lesions is limited on current nondedicated noncontrast CT. Musculoskeletal: Syndesmosis of right seventh and eighth  ribs. Sclerotic lesions of left third fourth and fifth ribs metastatic versus posttraumatic. IMPRESSION: Stable pulmonary nodules including a left upper lobe spiculated 9 mm nodule (primary versus metastasis). No new nodules. Follow-up according to oncologic protocols . Sclerotic rib lesions, new to prior exam from 2024. Metastasis from prostate cancer can not be excluded. Recommend nuclear medicine bone scan for further assessment. Electronically Signed   By: Megan  Zare M.D.   On: 09/26/2024 13:30    ASSESSMENT AND PLAN: This is a very pleasant 78 years old white male with history of prostate adenocarcinoma diagnosed in 2017 status post radiotherapy as well as short course of hormonal therapy.  The patient also has suspicious pulmonary nodules in the left upper lobe as well as the right lower lobe that has been on close monitoring. The patient is currently on observation and he is feeling fine. He had repeat CT scan of the chest performed recently that showed stable pulmonary nodules but there was new sclerotic rib lesion and metastasis from prostate cancer could not be excluded but this is likely to be secondary to his recent trauma. Assessment and Plan Assessment & Plan Prostate cancer status post radiotherapy, evaluating for possible bone metastasis Prostate cancer with Gleason score of 9, diagnosed in 2018, status post definitive radiotherapy. Recent CT scan shows new sclerotic lesions in the rib, raising concern for possible bone metastasis. PSA level was 0 in June, which is reassuring, but further investigation is warranted due to the new findings. Differential diagnosis includes bone metastasis from prostate cancer or other causes such as trauma or benign bone lesions. Discussed potential progression if metastasis is confirmed, including spread to other bones and potential fractures. Emphasized the importance of further evaluation to determine the nature of the lesions. - Ordered PSMA PET scan to  evaluate for prostate cancer metastasis. - Ordered PSA level check.  Stable left lung nodule under observation Left lung nodule identified in May 2022, currently stable with no change on recent CT scan. Continues to be under observation. - Continue observation of left lung nodule. The patient was advised to call immediately if he has any concerning symptoms in the interval. The patient voices understanding of current disease status and treatment options and is in agreement with the current care plan.  All questions were answered. The patient knows to call the clinic with any problems, questions or concerns. We can certainly see the patient much sooner if necessary.  The total time spent in the appointment was 30 minutes.  Disclaimer: This note was dictated with voice recognition software. Similar sounding words can inadvertently be transcribed and may not be corrected upon review.

## 2024-10-15 LAB — PSA, TOTAL AND FREE
PSA, Free Pct: 15 %
PSA, Free: 0.03 ng/mL
Prostate Specific Ag, Serum: 0.2 ng/mL (ref 0.0–4.0)

## 2024-10-27 ENCOUNTER — Encounter (HOSPITAL_COMMUNITY)
Admission: RE | Admit: 2024-10-27 | Discharge: 2024-10-27 | Disposition: A | Discharge status: Home / Self Care | Source: Ambulatory Visit | Attending: Internal Medicine | Admitting: Internal Medicine

## 2024-10-27 DIAGNOSIS — C61 Malignant neoplasm of prostate: Secondary | ICD-10-CM | POA: Insufficient documentation

## 2024-10-27 MED ORDER — FLOTUFOLASTAT F 18 GALLIUM 296-5846 MBQ/ML IV SOLN
8.6600 | Freq: Once | INTRAVENOUS | Status: AC
  Administered 2024-10-27: 8.66 via INTRAVENOUS

## 2024-11-04 ENCOUNTER — Inpatient Hospital Stay: Admitting: Internal Medicine

## 2024-11-04 VITALS — BP 120/78 | HR 72 | Temp 97.6°F | Resp 17 | Ht 67.0 in | Wt 187.0 lb

## 2024-11-04 DIAGNOSIS — C61 Malignant neoplasm of prostate: Secondary | ICD-10-CM

## 2024-11-04 DIAGNOSIS — M899 Disorder of bone, unspecified: Secondary | ICD-10-CM | POA: Diagnosis not present

## 2024-11-04 NOTE — Progress Notes (Signed)
 "     [facility] [phone]   [phone]  OFFICE PROGRESS NOTE  [name], DO [address] 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  He was found to have a 13 mm mass with differential diagnosis of low-grade malignancy versus benign findings.   PRIOR THERAPY: status post definitive therapy with radiation for Gleason score 9 prostate cancer in 2018.   CURRENT THERAPY: Observation  INTERVAL HISTORY: Alan Rivera 78 y.o. male returns to the clinic today for annual follow-up visit accompanied by his wife.Discussed the use of AI scribe software for clinical note transcription with the patient, who gave verbal consent to proceed.  History of Present Illness Alan Rivera is an 78 year old male with prostate adenocarcinoma who presents for evaluation of a recent PSMA PET scan following identification of a new sclerotic rib lesion on surveillance imaging.  He was diagnosed with high-grade prostate adenocarcinoma (Gleason 9) in 2018 and underwent definitive radiotherapy. Surveillance imaging in 2022 identified a left lung nodule, and in 2024, CT revealed a new sclerotic lesion in the left fourth and fifth ribs. A recent PSMA PET scan was performed; the official report is pending. Informal review did not show significant abnormal uptake, though mild activity was noted in the right iliac bone. PSA remains within normal limits.  He is asymptomatic, reporting no pain, respiratory, genitourinary, or constitutional symptoms. He denies new musculoskeletal complaints and specifically notes a history of pelvic fracture, which may be relevant to the imaging findings.    MEDICAL HISTORY: Past Medical History:  Diagnosis Date   Bilateral renal cysts    Diverticulosis    ED (erectile dysfunction)    Family history of malignant neoplasm of  prostate    Frequency-urgency syndrome    HOH (hard of hearing)    left side   HTN (hypertension) 09/18/2016   Hypertension    Impaired fasting glucose    Irregular heartbeat    Malignant neoplasm of prostate (HCC) 09/18/2016   Osteoporosis    Polio    age 70-- effected bilateral feet--  currently bilateral diffuse weakness upper and lower extremities   Post-polio syndrome    bilateral diffuse weakness upper and lower extremities, ambulates 50-70 yards   Prostate cancer (HCC) UROLOGIST-  [name]/  ONCOLOGIST -- [name]   dx 10/ 2017-- Stage T2a,  Gleason 4+5,  PSA 6.68, vol 43cc   Umbilical hernia    Uses walker    prn   Wears glasses     ALLERGIES:  is allergic to shellfish allergy.  MEDICATIONS:  Current Outpatient Medications  Medication Sig Dispense Refill   alendronate (FOSAMAX) 70 MG tablet Take 70 mg by mouth once a week. Take with a full glass of water on an empty stomach. (Patient not taking: Reported on 04/18/2022)     atorvastatin (LIPITOR) 10 MG tablet Take 10 mg by mouth daily.     COVID-19 mRNA bivalent vaccine, Pfizer, (PFIZER COVID-19 VAC BIVALENT) injection Inject into the muscle. 0.3 mL 0   EPINEPHrine (EPIPEN JR) 0.15 MG/0.3ML injection Inject 0.15 mg into the muscle as needed for anaphylaxis.     lisinopril (PRINIVIL,ZESTRIL) 10 MG tablet Take 10 mg by mouth daily.     Omega 3-6-9 Fatty Acids (OMEGA-3-6-9 PO) Take 750 mg by mouth in the morning and at bedtime.     oxyCODONE  (OXY IR/ROXICODONE ) 5 MG  immediate release tablet Take 1 tablet (5 mg total) by mouth every 6 (six) hours as needed for severe pain. (Patient not taking: Reported on 04/18/2022) 12 tablet 0   No current facility-administered medications for this visit.    SURGICAL HISTORY:  Past Surgical History:  Procedure Laterality Date   FOOT SURGERY Bilateral child   l related to effects of polio   PROSTATE BIOPSY     PROSTATE BIOPSY N/A 01/05/2017   Procedure: PROSTATE ULTRASOUND WITH SPACE  OAR;  Surgeon: Belvie LITTIE Clara, MD;  Location: [facility];  Service: Urology;  Laterality: N/A;   SHOULDER ARTHROSCOPY WITH OPEN ROTATOR CUFF REPAIR Bilateral right 04/30/2007;  left 10-15-2007   w/ Debridement/  DCR/  SAD/  Acromioplasty/  Bursectomy   TONSILLECTOMY AND ADENOIDECTOMY      REVIEW OF SYSTEMS:  Constitutional: negative Eyes: negative Ears, nose, mouth, throat, and face: negative Respiratory: negative Cardiovascular: negative Gastrointestinal: negative Genitourinary:negative Integument/breast: negative Hematologic/lymphatic: negative Musculoskeletal:negative Neurological: negative Behavioral/Psych: negative Endocrine: negative Allergic/Immunologic: negative   PHYSICAL EXAMINATION: General appearance: alert, cooperative, fatigued, and no distress Head: Normocephalic, without obvious abnormality, atraumatic Neck: no adenopathy, no JVD, supple, symmetrical, trachea midline, and thyroid  not enlarged, symmetric, no tenderness/mass/nodules Lymph nodes: Cervical, supraclavicular, and axillary nodes normal. Resp: clear to auscultation bilaterally Back: symmetric, no curvature. ROM normal. No CVA tenderness. Cardio: regular rate and rhythm, S1, S2 normal, no murmur, click, rub or gallop GI: soft, non-tender; bowel sounds normal; no masses,  no organomegaly Extremities: extremities normal, atraumatic, no cyanosis or edema Neurologic: Alert and oriented X 3, normal strength and tone. Normal symmetric reflexes. Normal coordination and gait  ECOG PERFORMANCE STATUS: 1 - Symptomatic but completely ambulatory  Blood pressure 120/78, pulse 72, temperature 97.6 F (36.4 C), temperature source Temporal, resp. rate 17, height 5' 7 (1.702 m), weight 187 lb (84.8 kg), SpO2 97%.  LABORATORY DATA: Lab Results  Component Value Date   WBC 5.4 10/14/2024   HGB 14.8 10/14/2024   HCT 43.7 10/14/2024   MCV 91.8 10/14/2024   PLT 165 10/14/2024      Chemistry       Component Value Date/Time   NA 142 10/14/2024 1112   K 4.2 10/14/2024 1112   CL 106 10/14/2024 1112   CO2 27 10/14/2024 1112   BUN 15 10/14/2024 1112   CREATININE 0.77 10/14/2024 1112      Component Value Date/Time   CALCIUM 9.5 10/14/2024 1112   ALKPHOS 87 10/14/2024 1112   AST 24 10/14/2024 1112   ALT 18 10/14/2024 1112   BILITOT 0.4 10/14/2024 1112       RADIOGRAPHIC STUDIES: No results found.   ASSESSMENT AND PLAN: This is a very pleasant 78 years old white male with history of prostate adenocarcinoma diagnosed in 2017 status post radiotherapy as well as short course of hormonal therapy.  The patient also has suspicious pulmonary nodules in the left upper lobe as well as the right lower lobe that has been on close monitoring. The patient is currently on observation and he is feeling fine. He had repeat PSMA PET scan performed recently.  The final report is still pending I personally and independently reviewed the images and discussed the result with the patient.  I do not see any clear evidence for metastatic disease but I will wait for the final report for confirmation.  There may be some mild activity on 1 of the left ribs as well as the right iliac bone but this could be  secondary to trauma or physiologic in nature.  I will wait for the final report for confirmation.  His most recent PSA was normal. Assessment and Plan Assessment & Plan Prostate adenocarcinoma with possible bone and lung metastases High-grade prostate adenocarcinoma (Gleason 9) diagnosed in 2018, status post definitive radiotherapy. A sclerotic rib lesion identified on chest CT in 2024 raised concern for metastatic disease. Recent PSMA PET scan (preliminary review) showed no definitive evidence of active metastatic disease; mild activity in the right iliac bone may be benign and related to prior pelvic fracture or degenerative changes. PSA remains within normal limits. He is asymptomatic. Impression: no clear  evidence of disease progression or symptomatic metastases. - Reviewed preliminary PSMA PET scan images; no definitive metastatic disease identified. Awaiting official radiology report. - Monitored PSA, which remains normal. - Will review official PSMA PET scan report when available and contact him if concerning findings are present. - If PSMA PET scan is unremarkable, will continue observation and follow up in one year. - If PSMA PET scan reveals concerning findings, will arrange earlier follow-up to discuss management options. - Instructed that results will be available on his chart and he will be contacted only if there is a concerning finding. - Discussed that, given absence of symptoms and current findings, urgent intervention is not indicated even if minor abnormalities are noted. - Documented travel plans and confirmed follow-up can be arranged as needed upon his return. The patient was advised to call immediately if he has any concerning symptoms in the interval. The patient voices understanding of current disease status and treatment options and is in agreement with the current care plan.  All questions were answered. The patient knows to call the clinic with any problems, questions or concerns. We can certainly see the patient much sooner if necessary.  The total time spent in the appointment was 30 minutes including review of chart and various tests results, discussions about plan of care and coordination of care plan .   Disclaimer: This note was dictated with voice recognition software. Similar sounding words can inadvertently be transcribed and may not be corrected upon review.        "

## 2024-11-05 ENCOUNTER — Telehealth: Payer: Self-pay | Admitting: Internal Medicine

## 2024-11-05 NOTE — Telephone Encounter (Signed)
 Left a voice message of the scheduled appt

## 2024-11-26 ENCOUNTER — Inpatient Hospital Stay

## 2024-12-04 ENCOUNTER — Inpatient Hospital Stay: Admitting: Internal Medicine
